# Patient Record
Sex: Female | Born: 1990 | Race: White | Hispanic: No | Marital: Single | Smoking: Never smoker
Health system: Southern US, Community
[De-identification: ages and names within clinical notes are randomized; demographics above are authoritative.]

## PROBLEM LIST (undated history)

## (undated) DIAGNOSIS — G809 Cerebral palsy, unspecified: Secondary | ICD-10-CM

## (undated) DIAGNOSIS — K219 Gastro-esophageal reflux disease without esophagitis: Secondary | ICD-10-CM

## (undated) DIAGNOSIS — Q040 Congenital malformations of corpus callosum: Secondary | ICD-10-CM

## (undated) HISTORY — PX: MOUTH SURGERY: SHX715

## (undated) HISTORY — PX: ANKLE SURGERY: SHX546

## (undated) HISTORY — PX: EYE SURGERY: SHX253

---
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
## 2010-05-29 ENCOUNTER — Other Ambulatory Visit: Payer: Self-pay | Admitting: Family Medicine

## 2010-05-29 DIAGNOSIS — G809 Cerebral palsy, unspecified: Secondary | ICD-10-CM

## 2010-06-09 ENCOUNTER — Other Ambulatory Visit: Payer: Self-pay

## 2010-06-24 ENCOUNTER — Other Ambulatory Visit (HOSPITAL_COMMUNITY): Payer: Self-pay | Admitting: Family Medicine

## 2010-06-24 DIAGNOSIS — Z9189 Other specified personal risk factors, not elsewhere classified: Secondary | ICD-10-CM

## 2010-07-02 ENCOUNTER — Ambulatory Visit (HOSPITAL_COMMUNITY): Admission: RE | Admit: 2010-07-02 | Payer: Medicaid Other | Source: Ambulatory Visit

## 2010-08-28 NOTE — Discharge Summary (Signed)
Savannah Ruiz, Savannah Ruiz              ACCOUNT NO.:  [id number]   MEDICAL RECORD NO.:  [id number]          PATIENT TYPE:  INP   LOCATION:  6125                         FACILITY:  [facility]   PHYSICIAN:  Mark C. Ophelia Charter, M.D.    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   PROCEDURES:  Bilateral triple arthrodeses.   HISTORY OF PRESENT ILLNESS:  This 20 year old female has been followed with  spasticity with ambulation, wearing bilateral AFOs and skin problems due to  progressive deformity and inability to wear AFOs for support.  Posterior  tibia was active.  She had significant planovalgus laxity.  She was brought  in for triple arthrodesis bilaterally so that she could be fitted with  braces to continue ambulation.   After preoperative Ancef, informed consent with her mother, routine lab work  was unremarkable.  She was taken to the operating room and underwent  bilateral triple arthrodeses with 2 pins in each foot, postoperative casting  and cast split spread.  Postoperatively she was on some medication for  spasticity as well as morphine.  She had difficulty voiding and a Foley had  to be inserted.  She had nausea and vomiting x 2-3 after morphine.  She was  voiding on her own at the time of discharge and was comfortable with foot  elevation, good capillary refill.  Hospital followup in 1 week.   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      Mark C. Ophelia Charter, M.D.  Electronically Signed     MCY/MEDQ  D:  10/10/2005  T:  10/10/2005  Job:  [id number]

## 2010-08-28 NOTE — Op Note (Signed)
NAMESORAYAH, SCHRODT              ACCOUNT NO.:  [id number]   MEDICAL RECORD NO.:  [id number]          PATIENT TYPE:  INP   LOCATION:  6125                         FACILITY:  [facility]   PHYSICIAN:  Mark C. Ophelia Charter, M.D.    DATE OF BIRTH:  [birth year]   DATE OF PROCEDURE:  08/30/2005  DATE OF DISCHARGE:                                 OPERATIVE REPORT   PREOPERATIVE DIAGNOSIS:  Cerebral palsy, corpus callosum agenesis with  bilateral painful planovalgus feet.   POSTOPERATIVE DIAGNOSIS:  Cerebral palsy, corpus callosum agenesis with  bilateral painful planovalgus feet.   OPERATION PERFORMED:  Bilateral triple arthrodesis.   SURGEON:  Mark C. Ophelia Charter, M.D.   ANESTHESIA:  General plus Marcaine local.   TOURNIQUET TIME:  Left 1 hour 10 minutes x 250 mmHg, right 47 minutes x 250  mmHg.   DESCRIPTION OF PROCEDURE:  After induction of general anesthesia,  preoperative antibiotics, the patient received 1 g of Ancef preoperatively.  Bilateral proximal thigh tourniquets were applied over stockinette and both  feet were prepped with DuraPrep up to the knees.  Stockinettes, extremity  sheets, rolled towel, towel clip were applied.  Left foot was more severe  and was done first.  Leg was wrapped with an Esmarch, tourniquet inflated.  Skin marker lines were made with oblique incisions halfway between the  distal aspect of the fibula and the base of the fifth metatarsal following  the skin folds.  The perineal tendon was used for landmark, carefully  protected.  Osteo-periosteal flap was made exposing the calcaneocuboid joint  using a 1/4 inch curved osteotome.  The cartilage was scraped off both sides  of the joint.  Sustentaculum tali was knocked off with a 1/2 inch straight  osteotome exposing the anterior facet of the subtalar joint.  This was  cleaned off with a laminar spreader, placed in the midportion of the  subtalar joint exposing the anterior and posterior subtalar joint articular  surface.  Cartilage was stripped off using the quarter inch curved osteotome  removing the articular cartilage, exposing the subchondral bone.  There was  considerable valgus positioning with uncovering of the talus medially and  after the lateral aspect of the talonavicular joint was cleaned off with  Crego protecting the anterior structures, incision was made medially.  Talonavicular joint was exposed medially and the bone was cleared off the  head of the talus and navicula.  With all cartilage removed, good  subchondral bone, foot was repositioned, stab incisions were made medial and  lateral and pin was drilled across the calcaneocuboid joint retrograde and  then another pin drilled across the talonavicular joint retrograde.  Talus  was held in a covered position and bone graft was packed into the joint from  the sustentaculum tali that had been cleaned off and packed particularly in  the talonavicular joint.  AP lateral and oblique spot fluoro pictures were  taken documenting good position of the foot and spot picture as taken during  the procedure to make sure that the posterior aspect of the subtalar joint  was adequately cleaned off and this was  extended all the way back to the  capsule.  The Vicryl 4-0 was used for reapproximating the subcutaneous  tissue, chromic 4-0 subcuticular skin closure.  The stab incisions were  enlarged slightly for release of skin tension on the pins.  The pins were  bent 90 degrees and cut.  Medial and lateral incisions were closed with  identical sutures, see above.  Tourniquet was deflated prior to closure.  Hemovac was placed through a separate stab incision, left laterally.  Xeroform after Marcaine with epinephrine, 4 x 4s ABD and Webril was applied.   The patient's right foot which was not as severe as the left was then  surgically treated.  An identical procedure was performed on this foot in  the same sequence.  On the right foot there was not as  severe uncoverage of  the talus with planovalgus deformity and a medial incision was not necessary  in order to get the talonavicular joint adequately prepared.  Osteo-  periosteal flap was used for exposure of the calcaneocuboid and subtalar  joints were cleaned off.  Identical pinning was performed.  Fluoro spot  pictures.  Tourniquet deflation, hemostasis, Hemovac drain placement and  then closure.  Chromic again was used on the skin.  Xeroform.  Pins were cut  after being bent and position of the foot showed good correction of the  planovalgus deformity.  Good length of the pins.  The wound was irrigated.  Marcaine then was infiltrated prior to closure and identical bilateral short  leg fiberglass casts were applied.  Casts were split anteriorly, spread and  then patient was then transferred to the recovery room in stable condition.  Instrument count and needle count was correct.      Mark C. Ophelia Charter, M.D.  Electronically Signed     MCY/MEDQ  D:  08/30/2005  T:  08/31/2005  Job:  [id number]

## 2018-10-17 ENCOUNTER — Emergency Department (HOSPITAL_COMMUNITY): Payer: Medicaid Other

## 2018-10-17 ENCOUNTER — Other Ambulatory Visit: Payer: Self-pay

## 2018-10-17 ENCOUNTER — Ambulatory Visit: Admit: 2018-10-17 | Payer: Medicaid Other | Admitting: Otolaryngology

## 2018-10-17 ENCOUNTER — Encounter (HOSPITAL_COMMUNITY)
Admission: EM | Disposition: A | Discharge status: Home / Self Care | Payer: Self-pay | Source: Home / Self Care | Attending: Otolaryngology

## 2018-10-17 ENCOUNTER — Emergency Department (HOSPITAL_COMMUNITY): Payer: Medicaid Other | Admitting: Certified Registered Nurse Anesthetist

## 2018-10-17 ENCOUNTER — Encounter (HOSPITAL_COMMUNITY): Payer: Self-pay

## 2018-10-17 ENCOUNTER — Ambulatory Visit (HOSPITAL_COMMUNITY)
Admission: EM | Admit: 2018-10-17 | Discharge: 2018-10-18 | Disposition: A | Discharge status: Home / Self Care | Payer: Medicaid Other | Attending: Otolaryngology | Admitting: Otolaryngology

## 2018-10-17 DIAGNOSIS — S01511A Laceration without foreign body of lip, initial encounter: Secondary | ICD-10-CM | POA: Diagnosis not present

## 2018-10-17 DIAGNOSIS — S0181XA Laceration without foreign body of other part of head, initial encounter: Secondary | ICD-10-CM | POA: Insufficient documentation

## 2018-10-17 DIAGNOSIS — S02609B Fracture of mandible, unspecified, initial encounter for open fracture: Secondary | ICD-10-CM | POA: Diagnosis present

## 2018-10-17 DIAGNOSIS — Z79899 Other long term (current) drug therapy: Secondary | ICD-10-CM | POA: Insufficient documentation

## 2018-10-17 DIAGNOSIS — Z791 Long term (current) use of non-steroidal anti-inflammatories (NSAID): Secondary | ICD-10-CM | POA: Diagnosis not present

## 2018-10-17 DIAGNOSIS — S02609A Fracture of mandible, unspecified, initial encounter for closed fracture: Secondary | ICD-10-CM

## 2018-10-17 DIAGNOSIS — W19XXXA Unspecified fall, initial encounter: Secondary | ICD-10-CM | POA: Insufficient documentation

## 2018-10-17 DIAGNOSIS — S0266XA Fracture of symphysis of mandible, initial encounter for closed fracture: Secondary | ICD-10-CM | POA: Insufficient documentation

## 2018-10-17 DIAGNOSIS — G809 Cerebral palsy, unspecified: Secondary | ICD-10-CM | POA: Diagnosis not present

## 2018-10-17 DIAGNOSIS — S02651A Fracture of angle of right mandible, initial encounter for closed fracture: Secondary | ICD-10-CM | POA: Diagnosis not present

## 2018-10-17 DIAGNOSIS — S01512A Laceration without foreign body of oral cavity, initial encounter: Secondary | ICD-10-CM | POA: Insufficient documentation

## 2018-10-17 DIAGNOSIS — Z1159 Encounter for screening for other viral diseases: Secondary | ICD-10-CM | POA: Insufficient documentation

## 2018-10-17 HISTORY — PX: ORIF MANDIBULAR FRACTURE: SHX2127

## 2018-10-17 LAB — COMPREHENSIVE METABOLIC PANEL
ALT: 41 U/L (ref 0–44)
AST: 35 U/L (ref 15–41)
Albumin: 4 g/dL (ref 3.5–5.0)
Alkaline Phosphatase: 82 U/L (ref 38–126)
Anion gap: 9 (ref 5–15)
BUN: 13 mg/dL (ref 6–20)
CO2: 25 mmol/L (ref 22–32)
Calcium: 8.5 mg/dL — ABNORMAL LOW (ref 8.9–10.3)
Chloride: 107 mmol/L (ref 98–111)
Creatinine, Ser: 0.34 mg/dL — ABNORMAL LOW (ref 0.44–1.00)
GFR calc Af Amer: >60 mL/min (ref >60)
GFR calc non Af Amer: >60 mL/min (ref >60)
Glucose, Bld: 99 mg/dL (ref 70–99)
Potassium: 3.5 mmol/L (ref 3.5–5.1)
Sodium: 141 mmol/L (ref 135–145)
Total Bilirubin: 0.2 mg/dL — ABNORMAL LOW (ref 0.3–1.2)
Total Protein: 7 g/dL (ref 6.5–8.1)

## 2018-10-17 LAB — CBC WITH DIFFERENTIAL/PLATELET
Abs Immature Granulocytes: 0.07 10*3/uL (ref 0.00–0.07)
Basophils Absolute: 0.1 10*3/uL (ref 0.0–0.1)
Basophils Relative: 0 %
Eosinophils Absolute: 0 10*3/uL (ref 0.0–0.5)
Eosinophils Relative: 0 %
HCT: 38.3 % (ref 36.0–46.0)
Hemoglobin: 12.3 g/dL (ref 12.0–15.0)
Immature Granulocytes: 0 %
Lymphocytes Relative: 10 %
Lymphs Abs: 1.7 10*3/uL (ref 0.7–4.0)
MCH: 31.1 pg (ref 26.0–34.0)
MCHC: 32.1 g/dL (ref 30.0–36.0)
MCV: 96.7 fL (ref 80.0–100.0)
Monocytes Absolute: 1.5 10*3/uL — ABNORMAL HIGH (ref 0.1–1.0)
Monocytes Relative: 9 %
Neutro Abs: 12.9 10*3/uL — ABNORMAL HIGH (ref 1.7–7.7)
Neutrophils Relative %: 81 %
Platelets: 286 10*3/uL (ref 150–400)
RBC: 3.96 MIL/uL (ref 3.87–5.11)
RDW: 12.3 % (ref 11.5–15.5)
WBC: 16.3 10*3/uL — ABNORMAL HIGH (ref 4.0–10.5)
nRBC: 0 % (ref 0.0–0.2)

## 2018-10-17 LAB — SARS CORONAVIRUS 2 BY RT PCR (HOSPITAL ORDER, PERFORMED IN ~~LOC~~ HOSPITAL LAB): SARS Coronavirus 2: NEGATIVE

## 2018-10-17 SURGERY — OPEN REDUCTION INTERNAL FIXATION (ORIF) MANDIBULAR FRACTURE
Anesthesia: General | Site: Face

## 2018-10-17 MED ORDER — MORPHINE SULFATE (PF) 2 MG/ML IV SOLN
2.0000 mg | Freq: Once | INTRAVENOUS | Status: AC
  Administered 2018-10-17: 2 mg via INTRAMUSCULAR
  Filled 2018-10-17: qty 1

## 2018-10-17 MED ORDER — MIDAZOLAM HCL 2 MG/2ML IJ SOLN
INTRAMUSCULAR | Status: AC
  Filled 2018-10-17: qty 2

## 2018-10-17 MED ORDER — 0.9 % SODIUM CHLORIDE (POUR BTL) OPTIME
TOPICAL | Status: DC | PRN
  Administered 2018-10-17: 18:00:00 1000 mL

## 2018-10-17 MED ORDER — ROCURONIUM BROMIDE 100 MG/10ML IV SOLN
INTRAVENOUS | Status: DC | PRN
  Administered 2018-10-17: 30 mg via INTRAVENOUS

## 2018-10-17 MED ORDER — ACETAMINOPHEN 10 MG/ML IV SOLN
460.0000 mg | Freq: Once | INTRAVENOUS | Status: DC | PRN
  Filled 2018-10-17: qty 46

## 2018-10-17 MED ORDER — HYDROCODONE-ACETAMINOPHEN 7.5-325 MG/15ML PO SOLN
5.0000 mL | Freq: Four times a day (QID) | ORAL | Status: DC | PRN
  Administered 2018-10-18: 5 mL via ORAL
  Filled 2018-10-17: qty 15

## 2018-10-17 MED ORDER — DEXTROSE IN LACTATED RINGERS 5 % IV SOLN
INTRAVENOUS | Status: DC
  Administered 2018-10-17: 23:00:00 via INTRAVENOUS

## 2018-10-17 MED ORDER — LORAZEPAM 2 MG/ML IJ SOLN
1.0000 mg | Freq: Once | INTRAMUSCULAR | Status: AC
  Administered 2018-10-17: 1 mg via INTRAMUSCULAR
  Filled 2018-10-17: qty 1

## 2018-10-17 MED ORDER — CEFAZOLIN SODIUM-DEXTROSE 1-4 GM/50ML-% IV SOLN
1.0000 g | Freq: Three times a day (TID) | INTRAVENOUS | Status: AC
  Administered 2018-10-17 – 2018-10-18 (×2): 1 g via INTRAVENOUS
  Filled 2018-10-17 (×2): qty 50

## 2018-10-17 MED ORDER — FENTANYL CITRATE (PF) 100 MCG/2ML IJ SOLN
INTRAMUSCULAR | Status: DC | PRN
  Administered 2018-10-17: 50 ug via INTRAVENOUS

## 2018-10-17 MED ORDER — DEXAMETHASONE SODIUM PHOSPHATE 10 MG/ML IJ SOLN
6.0000 mg | Freq: Three times a day (TID) | INTRAMUSCULAR | Status: AC
  Administered 2018-10-17 – 2018-10-18 (×2): 6 mg via INTRAVENOUS
  Filled 2018-10-17 (×2): qty 1

## 2018-10-17 MED ORDER — ONDANSETRON HCL 4 MG/2ML IJ SOLN
INTRAMUSCULAR | Status: DC | PRN
  Administered 2018-10-17: 4 mg via INTRAVENOUS

## 2018-10-17 MED ORDER — PROPOFOL 10 MG/ML IV BOLUS
INTRAVENOUS | Status: AC
  Filled 2018-10-17: qty 20

## 2018-10-17 MED ORDER — ONDANSETRON HCL 4 MG PO TABS: 4.0000 mg | ORAL_TABLET | ORAL | Status: DC | PRN

## 2018-10-17 MED ORDER — FENTANYL CITRATE (PF) 250 MCG/5ML IJ SOLN
INTRAMUSCULAR | Status: AC
  Filled 2018-10-17: qty 5

## 2018-10-17 MED ORDER — FENTANYL CITRATE (PF) 100 MCG/2ML IJ SOLN: 25.0000 ug | INTRAMUSCULAR | Status: DC | PRN

## 2018-10-17 MED ORDER — OXYMETAZOLINE HCL 0.05 % NA SOLN
NASAL | Status: AC
  Filled 2018-10-17: qty 30

## 2018-10-17 MED ORDER — OXYMETAZOLINE HCL 0.05 % NA SOLN
NASAL | Status: DC | PRN
  Administered 2018-10-17: 1

## 2018-10-17 MED ORDER — BACITRACIN ZINC 500 UNIT/GM EX OINT
TOPICAL_OINTMENT | CUTANEOUS | Status: DC | PRN
  Administered 2018-10-17: 1 via TOPICAL

## 2018-10-17 MED ORDER — LIDOCAINE-EPINEPHRINE 1 %-1:100000 IJ SOLN
INTRAMUSCULAR | Status: DC | PRN
  Administered 2018-10-17: 6 mL

## 2018-10-17 MED ORDER — PROMETHAZINE HCL 25 MG/ML IJ SOLN: 6.2500 mg | INTRAMUSCULAR | Status: DC | PRN

## 2018-10-17 MED ORDER — DEXAMETHASONE SODIUM PHOSPHATE 10 MG/ML IJ SOLN
INTRAMUSCULAR | Status: DC | PRN
  Administered 2018-10-17: 10 mg via INTRAVENOUS

## 2018-10-17 MED ORDER — SODIUM CHLORIDE 0.9 % IV SOLN
INTRAVENOUS | Status: DC | PRN
  Administered 2018-10-17: 17:00:00 via INTRAVENOUS

## 2018-10-17 MED ORDER — LACTATED RINGERS IV SOLN
INTRAVENOUS | Status: DC
  Administered 2018-10-17: 19:00:00 via INTRAVENOUS

## 2018-10-17 MED ORDER — IBUPROFEN 100 MG/5ML PO SUSP: 300.0000 mg | Freq: Three times a day (TID) | ORAL | Status: DC | PRN

## 2018-10-17 MED ORDER — BACITRACIN ZINC 500 UNIT/GM EX OINT
TOPICAL_OINTMENT | CUTANEOUS | Status: AC
  Filled 2018-10-17: qty 28.35

## 2018-10-17 MED ORDER — DEXTROSE 5 % IV SOLN: 0.5000 g | Freq: Once | INTRAVENOUS | Status: DC

## 2018-10-17 MED ORDER — LIDOCAINE HCL (CARDIAC) PF 100 MG/5ML IV SOSY
PREFILLED_SYRINGE | INTRAVENOUS | Status: DC | PRN
  Administered 2018-10-17: 100 mg via INTRAVENOUS

## 2018-10-17 MED ORDER — SUGAMMADEX SODIUM 200 MG/2ML IV SOLN
INTRAVENOUS | Status: DC | PRN
  Administered 2018-10-17: 61.6 mg via INTRAVENOUS

## 2018-10-17 MED ORDER — CEFAZOLIN SODIUM-DEXTROSE 1-4 GM/50ML-% IV SOLN
1.0000 g | Freq: Once | INTRAVENOUS | Status: DC
  Filled 2018-10-17: qty 50

## 2018-10-17 MED ORDER — ONDANSETRON HCL 4 MG/2ML IJ SOLN: 4.0000 mg | INTRAMUSCULAR | Status: DC | PRN

## 2018-10-17 MED ORDER — BACITRACIN ZINC 500 UNIT/GM EX OINT
1.0000 "application " | TOPICAL_OINTMENT | Freq: Three times a day (TID) | CUTANEOUS | Status: DC
  Administered 2018-10-17 – 2018-10-18 (×2): 1 via TOPICAL
  Filled 2018-10-17: qty 28.4

## 2018-10-17 MED ORDER — LIDOCAINE-EPINEPHRINE 1 %-1:100000 IJ SOLN
INTRAMUSCULAR | Status: AC
  Filled 2018-10-17: qty 1

## 2018-10-17 MED ORDER — MIDAZOLAM HCL 5 MG/5ML IJ SOLN
INTRAMUSCULAR | Status: DC | PRN
  Administered 2018-10-17: 1 mg via INTRAVENOUS

## 2018-10-17 MED ORDER — TETANUS-DIPHTH-ACELL PERTUSSIS 5-2.5-18.5 LF-MCG/0.5 IM SUSP
0.5000 mL | Freq: Once | INTRAMUSCULAR | Status: AC
  Administered 2018-10-17: 0.5 mL via INTRAMUSCULAR
  Filled 2018-10-17: qty 0.5

## 2018-10-17 SURGICAL SUPPLY — 51 items
BIT DRILL DEPTH MARK1.8X115 26 (MISCELLANEOUS) IMPLANT
BLADE SURG 15 STRL LF DISP TIS (BLADE) IMPLANT
BLADE SURG 15 STRL SS (BLADE)
CANISTER SUCT 3000ML PPV (MISCELLANEOUS) ×3 IMPLANT
CLEANER TIP ELECTROSURG 2X2 (MISCELLANEOUS) ×3 IMPLANT
COVER WAND RF STERILE (DRAPES) ×3 IMPLANT
DRAPE HALF SHEET 40X57 (DRAPES) IMPLANT
DRILL DEPTH MARK1.8X115 26 (MISCELLANEOUS) ×3
ELECT COATED BLADE 2.86 ST (ELECTRODE) ×3 IMPLANT
ELECT NDL TIP 2.8 STRL (NEEDLE) IMPLANT
ELECT NEEDLE TIP 2.8 STRL (NEEDLE) IMPLANT
ELECT REM PT RETURN 9FT ADLT (ELECTROSURGICAL) ×3
ELECTRODE REM PT RTRN 9FT ADLT (ELECTROSURGICAL) ×1 IMPLANT
GLOVE BIOGEL M 7.0 STRL (GLOVE) ×3 IMPLANT
GOWN STRL REUS W/ TWL LRG LVL3 (GOWN DISPOSABLE) ×2 IMPLANT
GOWN STRL REUS W/TWL LRG LVL3 (GOWN DISPOSABLE) ×6
KIT BASIN OR (CUSTOM PROCEDURE TRAY) ×3 IMPLANT
KIT TURNOVER KIT B (KITS) ×3 IMPLANT
NDL HYPO 25GX1X1/2 BEV (NEEDLE) IMPLANT
NEEDLE HYPO 25GX1X1/2 BEV (NEEDLE) IMPLANT
NS IRRIG 1000ML POUR BTL (IV SOLUTION) ×3 IMPLANT
PAD ARMBOARD 7.5X6 YLW CONV (MISCELLANEOUS) ×6 IMPLANT
PATTIES SURGICAL .5 X3 (DISPOSABLE) IMPLANT
PENCIL BUTTON HOLSTER BLD 10FT (ELECTRODE) ×3 IMPLANT
PLATE LOCK 4H STRT 1.6 GOLD (Plate) ×2 IMPLANT
PLATE LOCK SML ANGLE 1.6 GOLD (Plate) ×2 IMPLANT
SCISSORS WIRE DISP (INSTRUMENTS) ×3 IMPLANT
SCREW NON LOCK X-DR 2.3X10 (Screw) ×6 IMPLANT
SCREW NON LOCK X-DR 2.3X12 (Screw) ×8 IMPLANT
SCREW NON LOCK X-DR 2.3X8 (Screw) ×10 IMPLANT
SCREW SD IMF HEX 2.0X7 (Screw) ×4 IMPLANT
SCREW SD IMF HEX 2.0X9 (Screw) ×4 IMPLANT
STAPLER VISISTAT 35W (STAPLE) ×3 IMPLANT
SUT BONE WAX W31G (SUTURE) IMPLANT
SUT CHROMIC 3 0 SH 27 (SUTURE) ×6 IMPLANT
SUT ETHILON 3 0 PS 1 (SUTURE) ×3 IMPLANT
SUT ETHILON 6 0 P 1 (SUTURE) ×2 IMPLANT
SUT SILK 3 0 (SUTURE)
SUT SILK 3 0 SH 30 (SUTURE) ×3 IMPLANT
SUT SILK 3-0 18XBRD TIE 12 (SUTURE) IMPLANT
SUT STEEL 0 (SUTURE)
SUT STEEL 0 18XMFL TIE 17 (SUTURE) IMPLANT
SUT STEEL 2 (SUTURE) ×3 IMPLANT
SUT VIC AB 3-0 FS2 27 (SUTURE) IMPLANT
SUT VIC AB 4-0 P-3 18X BRD (SUTURE) IMPLANT
SUT VIC AB 4-0 P3 18 (SUTURE)
SUT VIC AB 5-0 P-3 18XBRD (SUTURE) IMPLANT
SUT VIC AB 5-0 P3 18 (SUTURE)
TOWEL GREEN STERILE FF (TOWEL DISPOSABLE) ×3 IMPLANT
TRAY ENT MC OR (CUSTOM PROCEDURE TRAY) ×3 IMPLANT
WATER STERILE IRR 1000ML POUR (IV SOLUTION) ×3 IMPLANT

## 2018-10-17 NOTE — ED Notes (Signed)
HALO TEST COMPLETED ON BLOOD FROM EAR IT WAS NEGATIVE FOR CEREBRAL FLUID

## 2018-10-17 NOTE — ED Provider Notes (Signed)
[facility]-EMERGENCY DEPT Provider Note   CSN: [id number] Arrival date & time: 10/17/18  1004  History   Chief Complaint Chief Complaint  Patient presents with   Fall    HPI Savannah Ruiz is a 28 y.o. female with past medical history significant for cerebral palsy, nonverbal at baseline who presents for evaluation of fall.  Mother presents with patient provides majority of history.  Kathleen LimeGiver was with patient when she was ambulating.  States that she normally walks with a walker behind her however they feel that the patient hit her right foot on the walker which caused her to fall on her face.  Mother noticed a laceration to her right lower lip as well as bleeding from her left ear.  She has been mentating appropriately per mother.  Moves 4 extremities without difficulty.  Mother states she is contracted at baseline.   Ambulatory with walker at baseline  5 caveat-- nonverbal at baseline  History provided by mother and past medical records. No interpretor was used.  Patient eats soft/ground food spoon feed at baseline.     HPI  History reviewed. No pertinent past medical history.  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 No pertinent surgical history.   OB History   No obstetric history on file.      Home Medications    Prior to Admission medications   Medication Sig Start Date End Date Taking? Authorizing Provider  ibuprofen (ADVIL) 200 MG tablet Take 200 mg by mouth every 6 (six) hours as needed for mild pain.   Yes [provider]  LORazepam (ATIVAN) 0.5 MG tablet Take 0.5 mg by mouth daily as needed for anxiety.  09/22/18  Yes [provider]  omeprazole (PRILOSEC) 20  MG capsule Take 20 mg by mouth daily. 09/28/18  Yes [provider]    Family History History reviewed. No pertinent family history.  Social History Social History   Tobacco Use   Smoking status: Never Smoker   Smokeless tobacco: Never Used  Substance Use Topics   Alcohol use: Never    Frequency: Never   Drug use: Never     Allergies   Patient has no known allergies.   Review of Systems Review of Systems  Unable to perform ROS: Patient nonverbal     Physical Exam Updated Vital Signs BP 128/85 (BP Location: Left Arm)    Pulse 99    Temp 97.6 F (36.4 C) (Axillary)    Resp 17    LMP 10/03/2018    SpO2 98%   Physical Exam Vitals signs and nursing note reviewed.  Constitutional:      General: She is not in acute distress.    Appearance: She is well-developed. She is not ill-appearing, toxic-appearing or diaphoretic.  HENT:     Head: Atraumatic.     Comments: 1cm non gapping laceration to inferior chin. No bleeding noted.    Ears:     Comments: Unable to visualize left tympanic membrane.  Blood pooling in external canal.  She does have scab to left external canal. Right TM without blood, effusion, perforation.    Nose: Nose normal.     Mouth/Throat:     Lips: Pink.  Mouth: Mucous membranes are moist.     Pharynx: Oropharynx is clear.     Comments: 8mm superficial laceration to inner right lower lip. No gaping. No bleeding. 1cm superficial laceration to right posterior tongue. No gaping. No active bleeding. No missing dentition. Fracture tooth to right posterior lower molar.  Eyes:     General: Lids are normal.     Extraocular Movements: Extraocular movements intact.     Conjunctiva/sclera: Conjunctivae normal.     Pupils: Pupils are equal, round, and reactive to light.  Neck:     Musculoskeletal: Normal range of motion and neck supple.     Comments: No midline spinal tenderness Cardiovascular:     Rate and Rhythm: Normal rate.     Pulses: Normal  pulses.          Dorsalis pedis pulses are 2+ on the right side and 2+ on the left side.       Posterior tibial pulses are 2+ on the right side and 2+ on the left side.     Heart sounds: Normal heart sounds.  Pulmonary:     Effort: Pulmonary effort is normal. No respiratory distress.     Breath sounds: Normal breath sounds and air entry.  Chest:     Comments: No crepitus, step-offs or contusions or abrasions to chest wall Abdominal:     General: There is no distension.     Comments: Soft without rebound or guarding no overlying skin changes to abdominal wall.  Musculoskeletal: Normal range of motion.     Comments: Contracted BL upper and lower extremities. Wiggles toes without difficulty.  Scoliosis present to spine.  No overlying skin changes to spine.  No obvious tenderness palpation to spine.  Skin:    General: Skin is warm and dry.     Comments: Brisk cap refill. No lacerations amenable to suturing.  Neurological:     General: No focal deficit present.     Mental Status: She is alert.     Comments: Patient steps with assistance.  She also crawls on the floor without any deficit.  Moves all 4 extremities without difficulty.     ED Treatments / Results  Labs (all labs ordered are listed, but only abnormal results are displayed) Labs Reviewed  CBC WITH DIFFERENTIAL/PLATELET - Abnormal; Notable for the following components:      Result Value   WBC 16.3 (*)    Neutro Abs 12.9 (*)    Monocytes Absolute 1.5 (*)    All other components within normal limits  SARS CORONAVIRUS 2 (HOSPITAL ORDER, PERFORMED IN [facility])  COMPREHENSIVE METABOLIC PANEL    EKG None  Radiology Ct Head Wo Contrast  Result Date: 10/17/2018 CLINICAL DATA:  Facial injury after fall. EXAM: CT HEAD WITHOUT CONTRAST CT MAXILLOFACIAL WITHOUT CONTRAST TECHNIQUE: Multidetector CT imaging of the head and maxillofacial structures were performed using the standard protocol without intravenous  contrast. Multiplanar CT image reconstructions of the maxillofacial structures were also generated. COMPARISON:  None. FINDINGS: CT HEAD FINDINGS Brain: No evidence of acute infarction, hemorrhage, hydrocephalus, extra-axial collection or mass lesion/mass effect. Vascular: No hyperdense vessel or unexpected calcification. Skull: Normal. Negative for fracture or focal lesion. Other: None. CT MAXILLOFACIAL FINDINGS Osseous: Nondisplaced fracture is seen involving the right parasymphyseal region of the mandible. Mildly displaced fracture is seen involving the right mandibular angle which extends to the most posterior molar. Orbits: Negative. No traumatic or inflammatory finding. Sinuses: Clear. Soft tissues: Negative. IMPRESSION: Mildly displaced right  mandibular angle fracture is noted, with nondisplaced right parasymphyseal fracture of the mandible. Normal head CT. Electronically Signed   By: Lupita RaiderJames  Green Jr M.D.   On: 10/17/2018 12:52   Ct Maxillofacial Wo Contrast  Result Date: 10/17/2018 CLINICAL DATA:  Facial injury after fall. EXAM: CT HEAD WITHOUT CONTRAST CT MAXILLOFACIAL WITHOUT CONTRAST TECHNIQUE: Multidetector CT imaging of the head and maxillofacial structures were performed using the standard protocol without intravenous contrast. Multiplanar CT image reconstructions of the maxillofacial structures were also generated. COMPARISON:  None. FINDINGS: CT HEAD FINDINGS Brain: No evidence of acute infarction, hemorrhage, hydrocephalus, extra-axial collection or mass lesion/mass effect. Vascular: No hyperdense vessel or unexpected calcification. Skull: Normal. Negative for fracture or focal lesion. Other: None. CT MAXILLOFACIAL FINDINGS Osseous: Nondisplaced fracture is seen involving the right parasymphyseal region of the mandible. Mildly displaced fracture is seen involving the right mandibular angle which extends to the most posterior molar. Orbits: Negative. No traumatic or inflammatory finding. Sinuses:  Clear. Soft tissues: Negative. IMPRESSION: Mildly displaced right mandibular angle fracture is noted, with nondisplaced right parasymphyseal fracture of the mandible. Normal head CT. Electronically Signed   By: Lupita RaiderJames  Green Jr M.D.   On: 10/17/2018 12:52    Procedures Procedures (including critical care time)  Medications Ordered in ED Medications  ceFAZolin (ANCEF) IVPB 1 g/50 mL premix (has no administration in time range)  LORazepam (ATIVAN) injection 1 mg (1 mg Intramuscular Given 10/17/18 1158)  Tdap (BOOSTRIX) injection 0.5 mL (0.5 mLs Intramuscular Given 10/17/18 1401)  morphine 2 MG/ML injection 2 mg (2 mg Intramuscular Given 10/17/18 1427)   Initial Impression / Assessment and Plan / ED Course  I have reviewed the triage vital signs and the nursing notes.  Pertinent labs & imaging results that were available during my care of the patient were reviewed by me and considered in my medical decision making (see chart for details).  28 year old with cerebral palsy, nonverbal at baseline presents for evaluation of mechanical fall.  Afebrile, nonseptic, non-ill-appearing.  Patient presents with mother who states patient is at her baseline mental status.  Patient with mechanical fall onto her head and face just PTA.  Patient was pulling blood in her left external ear.  Unable to visualize tympanic membrane secondary to blood.  She does have scab in this area.  She also has a millimeter non-gaping laceration to her right inner lip.  No active bleeding.  She has 1 cm laceration to her right posterior tongue without bleeding.  Laceration superficial.  Discussed with mother risk versus benefit of suturing including needed sedation.  Discussed rinsing of area after she eats or drinks with healing by secondary intent.  Been unable to obtain proper medical history as well as visualize tympanic membrane will need CT scan to assess for acute intracranial process.  Halo test negative per nursing.  Will give 1 mg  Ativan for light sedation for CT scan. Concern for possible open fracture given limited exam.  Would like to give IV Ancef for possible open fracture however mother would like to wait to talk with surgery first.  CT with mildly displaced right mandibular angle fracture is noted, with nondisplaced right parasymphyseal fracture of the mandible.  1436: Consulted with Dr. Annalee GentaShoemaker, ENT who states he will review images at office.  Patient may require surgical intervention.  He will call me back once he is able to look at the CT for dispo. Patient given Morphine for pain management and labs and COVID testing to  be obtain for possible surgical intervention.  1515: Pharmacy called and recommend 1g ANCEF instead of 500 mg. Will order.  Tilted again with Dr. Wilburn Cornelia who will take patient to the OR at [facility].  Patient will be transferred via [place].  She is hemodynamically stable at this time.  Patient to be transferred to preop.  Patient has had labs, COVID testing as well as Ancef given in ED prior to transfer.  Patient has been seen and evaluated by my attending, Dr. Vanita Panda who agrees with above treatment, plan and disposition.     Final Clinical Impressions(s) / ED Diagnoses   Final diagnoses:  Multiple mandibular fracture sites, closed, initial encounter [facility])    ED Discharge Orders    None       Ceferino Lang A, PA-C 10/17/18 1554    Carmin Muskrat, MD 10/18/18 [phone]

## 2018-10-17 NOTE — Op Note (Signed)
Operative Note: OPEN REDUCTION AND INTERNAL FIXATION OF MANDIBLE FRACTURE   Patient: [place] record number: [id number]  Date:10/17/2018  Pre-operative Indications: 1. Mandible fracture     2. Lower Lip Laceration     3.  2 cm facial laceration  Postoperative Indications: Same  Surgical Procedure: 1. ORIF of Mandible Fracture    2.  Closure lower lip laceration    3.  Complex closure facial laceration  Anesthesia: GET  Surgeon: Delsa Bern, M.D.  Complications: None  EBL: 100 cc   Brief History: The patient is a 28 y.o. female with a history of acute mandible fracture.  Maxillofacial CT scan showed mandibular fractures involving the right parasymphyseal region, minimally displaced, and a displaced right angle fracture.  Patient has severe developmental delay and is nonverbal she eats only soft diet.  Given her history and findings her parents and I discussed treatment options including mandibulomaxillary fixation versus ORIF of mandible fracture.  Given the patient's history and findings, I recommended ORIF of mandible fracture under general anesthesia, risks and benefits were discussed in detail with the patient and family. They understand and agree with our plan for surgery which is scheduled at [facility] on an emergency basis.  Surgical Procedure: The patient is brought to the operating room on 10/17/2018 and placed in supine position on the operating table. General nasal endotracheal anesthesia was established without difficulty. When the patient was adequately anesthetized, surgical timeout was performed and correct identification of the patient and the surgical procedure. The patient was positioned and prepped and draped in sterile fashion.  The patient was injected with 6 cc of 1% lidocaine 1 100,000 dilution epinephrine in a submucosal fashion along the anticipated incision lines.  An orogastric tube was passed and the stomach contents were  aspirated.  The patient's oral cavity was thoroughly examined.  Findings include posterior oral laceration at the mandibular angle fracture site with missing posterior molar.  2 cm lower lip laceration and 2 cm external laceration over the chin.  The patient was placed in good occlusion and the fracture sites were reduced.  Mandibulomaxillary fixation/arch bars were placed.  Four-point fixation was utilized to maintain occlusion throughout the ORIF procedure.  Gingivobuccal incisions were created over the fracture sites and soft tissue was elevated to expose the entire fracture.  Using mandibular reconstruction plates the fractures were fixated with plates and screws while the patient was maintained in good occlusion.  The posterior mandible angle fracture plate was affixed using a percutaneous approach for the posterior most screws.  The stab incision in the right posterior perimandibular area was closed with interrupted 6-0 Ethilon suture and treated with bacitracin ointment.  The plates, incision and mandible were thoroughly irrigated.  Intraoral incisions were then closed using interrupted 4-0 chromic suture.  Patient's oral cavity was irrigated and suctioned.  The intraoral laceration was then closed with interrupted 4-0 chromic suture.  The external chin 2 cm complex laceration was then closed in multiple layers consisting of interrupted 4-0 Vicryl suture followed by 6-0 Ethilon suture.  Bacitracin ointment was applied to the laceration.  An orogastric tube was passed and stomach contents were aspirated. Patient was awakened from anesthetic, extubated and transferred from the operating room to the recovery room in stable condition. There were no complications and blood loss was 100 cc.   Delsa Bern, M.D. [facility]

## 2018-10-17 NOTE — ED Notes (Signed)
Attempted to call short stay phone line is busy. Will attempt again shortly.

## 2018-10-17 NOTE — Anesthesia Preprocedure Evaluation (Addendum)
Anesthesia Evaluation  Patient identified by MRN, date of birth, ID band Patient awake    Reviewed: Allergy & Precautions, NPO status , Patient's Chart, lab work & pertinent test results  History of Anesthesia Complications Negative for: history of anesthetic complications  Airway   TM Distance: >3 FB Neck ROM: Full  Mouth opening: Limited Mouth Opening Comment: Unable to examine due to pain and mental status Dental  (+) Dental Advisory Given   Pulmonary neg pulmonary ROS,    Pulmonary exam normal        Cardiovascular negative cardio ROS Normal cardiovascular exam     Neuro/Psych Cerebral palsy, ambulates with walker, nonverbal    GI/Hepatic Neg liver ROS, GERD  Medicated and Controlled,  Endo/Other  negative endocrine ROS  Renal/GU negative Renal ROS     Musculoskeletal negative musculoskeletal ROS (+)   Abdominal   Peds  Hematology negative hematology ROS (+)   Anesthesia Other Findings Day of surgery medications reviewed with the patient.  Reproductive/Obstetrics                            Anesthesia Physical Anesthesia Plan  ASA: II  Anesthesia Plan: General   Post-op Pain Management:    Induction: Intravenous  PONV Risk Score and Plan: 4 or greater and Treatment may vary due to age or medical condition, Ondansetron, Dexamethasone, Scopolamine patch - Pre-op and Midazolam  Airway Management Planned: Nasal ETT and Video Laryngoscope Planned  Additional Equipment:   Intra-op Plan:   Post-operative Plan: Extubation in OR  Informed Consent: I have reviewed the patients History and Physical, chart, labs and discussed the procedure including the risks, benefits and alternatives for the proposed anesthesia with the patient or authorized representative who has indicated his/her understanding and acceptance.     Dental advisory given and Consent reviewed with POA  Plan  Discussed with:   Anesthesia Plan Comments: (Consent discussed with pt's mother at bedside.)       Anesthesia Quick Evaluation

## 2018-10-17 NOTE — Progress Notes (Signed)
Spoke to Dr. Daiva Huge concerning preg test. Doctor stated she did not need one before surgery.

## 2018-10-17 NOTE — H&P (Signed)
Savannah ROSEMAN is an 28 y.o. female.   Chief Complaint: mandible fracture HPI: Patient fell today and struck chin, [facility] ED shows displaced mandible fracture.  History reviewed. No pertinent past medical history.  History reviewed. No pertinent surgical history.  History reviewed. No pertinent family history. Social History:  reports that she has never smoked. She has never used smokeless tobacco. She reports that she does not drink alcohol or use drugs.  Allergies: No Known Allergies  Medications Prior to Admission  Medication Sig Dispense Refill  . ibuprofen (ADVIL) 200 MG tablet Take 200 mg by mouth every 6 (six) hours as needed for mild pain.    Marland Kitchen LORazepam (ATIVAN) 0.5 MG tablet Take 0.5 mg by mouth daily as needed for anxiety.     Marland Kitchen omeprazole (PRILOSEC) 20 MG capsule Take 20 mg by mouth daily.      Results for orders placed or performed during the hospital encounter of 10/17/18 (from the past 48 hour(s))  CBC with Differential     Status: Abnormal   Collection Time: 10/17/18  3:22 PM  Result Value Ref Range   WBC 16.3 (H) 4.0 - 10.5 K/uL   RBC 3.96 3.87 - 5.11 MIL/uL   Hemoglobin 12.3 12.0 - 15.0 g/dL   HCT 38.3 36.0 - 46.0 %   MCV 96.7 80.0 - 100.0 fL   MCH 31.1 26.0 - 34.0 pg   MCHC 32.1 30.0 - 36.0 g/dL   RDW 12.3 11.5 - 15.5 %   Platelets 286 150 - 400 K/uL   nRBC 0.0 0.0 - 0.2 %   Neutrophils Relative % 81 %   Neutro Abs 12.9 (H) 1.7 - 7.7 K/uL   Lymphocytes Relative 10 %   Lymphs Abs 1.7 0.7 - 4.0 K/uL   Monocytes Relative 9 %   Monocytes Absolute 1.5 (H) 0.1 - 1.0 K/uL   Eosinophils Relative 0 %   Eosinophils Absolute 0.0 0.0 - 0.5 K/uL   Basophils Relative 0 %   Basophils Absolute 0.1 0.0 - 0.1 K/uL   Immature Granulocytes 0 %   Abs Immature Granulocytes 0.07 0.00 - 0.07 K/uL    Comment: Performed at [facility], [place] [address] [place], [place] [address]  Comprehensive metabolic panel     Status: Abnormal   Collection Time: 10/17/18   3:22 PM  Result Value Ref Range   Sodium 141 135 - 145 mmol/L   Potassium 3.5 3.5 - 5.1 mmol/L   Chloride 107 98 - 111 mmol/L   CO2 25 22 - 32 mmol/L   Glucose, Bld 99 70 - 99 mg/dL   BUN 13 6 - 20 mg/dL   Creatinine, Ser 0.34 (L) 0.44 - 1.00 mg/dL   Calcium 8.5 (L) 8.9 - 10.3 mg/dL   Total Protein 7.0 6.5 - 8.1 g/dL   Albumin 4.0 3.5 - 5.0 g/dL   AST 35 15 - 41 U/L   ALT 41 0 - 44 U/L   Alkaline Phosphatase 82 38 - 126 U/L   Total Bilirubin 0.2 (L) 0.3 - 1.2 mg/dL   GFR calc non Af Amer >60 >60 mL/min   GFR calc Af Amer >60 >60 mL/min   Anion gap 9 5 - 15    Comment: Performed at [facility], [place] [address] [place], [place] [address]   Ct Head Wo Contrast  Result Date: 10/17/2018 CLINICAL DATA:  Facial injury after fall. EXAM: CT HEAD WITHOUT CONTRAST CT MAXILLOFACIAL WITHOUT CONTRAST TECHNIQUE: Multidetector CT  imaging of the head and maxillofacial structures were performed using the standard protocol without intravenous contrast. Multiplanar CT image reconstructions of the maxillofacial structures were also generated. COMPARISON:  None. FINDINGS: CT HEAD FINDINGS Brain: No evidence of acute infarction, hemorrhage, hydrocephalus, extra-axial collection or mass lesion/mass effect. Vascular: No hyperdense vessel or unexpected calcification. Skull: Normal. Negative for fracture or focal lesion. Other: None. CT MAXILLOFACIAL FINDINGS Osseous: Nondisplaced fracture is seen involving the right parasymphyseal region of the mandible. Mildly displaced fracture is seen involving the right mandibular angle which extends to the most posterior molar. Orbits: Negative. No traumatic or inflammatory finding. Sinuses: Clear. Soft tissues: Negative. IMPRESSION: Mildly displaced right mandibular angle fracture is noted, with nondisplaced right parasymphyseal fracture of the mandible. Normal head CT. Electronically Signed   By: Lupita RaiderJames  Green Jr M.D.   On: 10/17/2018 12:52   Ct Maxillofacial  Wo Contrast  Result Date: 10/17/2018 CLINICAL DATA:  Facial injury after fall. EXAM: CT HEAD WITHOUT CONTRAST CT MAXILLOFACIAL WITHOUT CONTRAST TECHNIQUE: Multidetector CT imaging of the head and maxillofacial structures were performed using the standard protocol without intravenous contrast. Multiplanar CT image reconstructions of the maxillofacial structures were also generated. COMPARISON:  None. FINDINGS: CT HEAD FINDINGS Brain: No evidence of acute infarction, hemorrhage, hydrocephalus, extra-axial collection or mass lesion/mass effect. Vascular: No hyperdense vessel or unexpected calcification. Skull: Normal. Negative for fracture or focal lesion. Other: None. CT MAXILLOFACIAL FINDINGS Osseous: Nondisplaced fracture is seen involving the right parasymphyseal region of the mandible. Mildly displaced fracture is seen involving the right mandibular angle which extends to the most posterior molar. Orbits: Negative. No traumatic or inflammatory finding. Sinuses: Clear. Soft tissues: Negative. IMPRESSION: Mildly displaced right mandibular angle fracture is noted, with nondisplaced right parasymphyseal fracture of the mandible. Normal head CT. Electronically Signed   By: Lupita RaiderJames  Green Jr M.D.   On: 10/17/2018 12:52    Review of Systems  HENT: Negative.     Blood pressure 128/85, pulse 99, temperature 97.6 F (36.4 C), temperature source Axillary, resp. rate 17, last menstrual period 10/03/2018, SpO2 98 %. Physical Exam  HENT:  Min displaced mandible fracture  Neck: Normal range of motion. Neck supple.  Cardiovascular: Normal rate.  Respiratory: Effort normal.     Assessment/Plan Adm for ORIF of mandible fracture  Osborn Cohoavid Camora Tremain, MD 10/17/2018, 4:33 PM

## 2018-10-17 NOTE — ED Triage Notes (Addendum)
Patient arrived with mother.   Patient fell aproximately 1 hour ago. Patient uses a walker. Lately patient has a "curvature" in her gate. Today she bumped her walker with her foot and fell forward face first.   Patient hit her chin, cut her lower lip, and blood on left ear.   Patients mother states she may have scratched her ear but, unsure.   Patient given 0.5 mg Lorazepam at 7am.  Patient given 2nd dose of 0.5 mgLorazepam 1 hour ago.   Patient is non verbal.   Mother at bedside.

## 2018-10-17 NOTE — ED Notes (Signed)
ANCEF given to [facility] to administer in route.

## 2018-10-17 NOTE — ED Notes (Signed)
Bed: ZO10 Expected date:  Expected time:  Means of arrival:  Comments: Floor wet

## 2018-10-17 NOTE — Anesthesia Procedure Notes (Signed)
Procedure Name: Intubation Date/Time: 10/17/2018 6:08 PM Performed by: Eligha Bridegroom, CRNA Pre-anesthesia Checklist: Timeout performed, Patient being monitored, Suction available, Emergency Drugs available and Patient identified Patient Re-evaluated:Patient Re-evaluated prior to induction Oxygen Delivery Method: Circle system utilized Preoxygenation: Pre-oxygenation with 100% oxygen Induction Type: IV induction Ventilation: Mask ventilation without difficulty Grade View: Grade I Nasal Tubes: Right, Nasal prep performed, Nasal Rae and Magill forceps - small, utilized Tube size: 6.5 mm Number of attempts: 1 Airway Equipment and Method: Stylet Placement Confirmation: positive ETCO2 and breath sounds checked- equal and bilateral Secured at: 23 cm Tube secured with: Tape Dental Injury: Teeth and Oropharynx as per pre-operative assessment

## 2018-10-17 NOTE — Transfer of Care (Signed)
Immediate Anesthesia Transfer of Care Note  Patient: Savannah Ruiz  Procedure(s) Performed: OPEN REDUCTION INTERNAL FIXATION (ORIF) OF MANDIBULAR FRACTURE (N/A Face)  Patient Location: PACU  Anesthesia Type:General  Level of Consciousness: awake and alert   Airway & Oxygen Therapy: Patient Spontanous Breathing and Patient connected to face mask oxygen  Post-op Assessment: Report given to RN and Post -op Vital signs reviewed and stable  Post vital signs: Reviewed and stable  Last Vitals:  Vitals Value Taken Time  BP 119/70 10/17/18 2042  Temp    Pulse 94 10/17/18 2042  Resp 17 10/17/18 2042  SpO2 95 % 10/17/18 2042  Vitals shown include unvalidated device data.  Last Pain:  Vitals:   10/17/18 1026  TempSrc: Axillary         Complications: No apparent anesthesia complications

## 2018-10-17 NOTE — ED Notes (Signed)
Patient transported to CT with assistance by mom and this RN.

## 2018-10-17 NOTE — ED Notes (Signed)
Report given to short stay.  

## 2018-10-17 NOTE — ED Notes (Signed)
Mother at bedside.

## 2018-10-17 NOTE — Anesthesia Postprocedure Evaluation (Signed)
Anesthesia Post Note  Patient: Savannah Ruiz  Procedure(s) Performed: OPEN REDUCTION INTERNAL FIXATION (ORIF) OF MANDIBULAR FRACTURE (N/A Face)     Patient location during evaluation: PACU Anesthesia Type: General Level of consciousness: awake Pain management: pain level controlled Vital Signs Assessment: post-procedure vital signs reviewed and stable Respiratory status: spontaneous breathing, nonlabored ventilation, respiratory function stable and patient connected to nasal cannula oxygen Cardiovascular status: blood pressure returned to baseline and stable Postop Assessment: no apparent nausea or vomiting Anesthetic complications: no    Last Vitals:  Vitals:   10/17/18 2115 10/17/18 2129  BP: 124/84 (!) 123/93  Pulse: 80 92  Resp: 16 20  Temp: 36.5 C 37.2 C  SpO2: 100% 100%    Last Pain:  Vitals:   10/17/18 2129  TempSrc: Oral                 Ryan P Ellender

## 2018-10-17 NOTE — OR Nursing (Signed)
Care of patient assumed at 1905. 

## 2018-10-18 ENCOUNTER — Encounter (HOSPITAL_COMMUNITY): Payer: Self-pay | Admitting: Otolaryngology

## 2018-10-18 MED ORDER — HYDROCODONE-ACETAMINOPHEN 7.5-325 MG/15ML PO SOLN: 5.0000 mL | Freq: Four times a day (QID) | ORAL | 0 refills | Status: AC | PRN

## 2018-10-18 MED ORDER — AMOXICILLIN 250 MG/5ML PO SUSR: 250.0000 mg | Freq: Three times a day (TID) | ORAL | 0 refills | Status: DC

## 2018-10-18 NOTE — Progress Notes (Signed)
   ENT Progress Note: POD #1 s/p Procedure(s): OPEN REDUCTION INTERNAL FIXATION (ORIF) OF MANDIBULAR FRACTURE   Subjective: Stable  Objective: Vital signs in last 24 hours: Temp:  [97.7 F (36.5 C)-99 F (37.2 C)] 98.3 F (36.8 C) (07/08 1056) Pulse Rate:  [68-99] 68 (07/08 1056) Resp:  [16-20] 16 (07/08 1056) BP: (115-124)/(63-93) 117/66 (07/08 1056) SpO2:  [91 %-100 %] 100 % (07/08 1056) Weight:  [30.8 kg] 30.8 kg (07/07 1632) Weight change:  Last BM Date: 10/16/18  Intake/Output from previous day: 07/07 0701 - 07/08 0700 In: 400 [I.V.:400] Out: -  Intake/Output this shift: No intake/output data recorded.  Labs: Recent Labs    10/17/18 1522  WBC 16.3*  HGB 12.3  HCT 38.3  PLT 286   Recent Labs    10/17/18 1522  NA 141  K 3.5  CL 107  CO2 25  GLUCOSE 99  BUN 13  CALCIUM 8.5*    Studies/Results: Ct Head Wo Contrast  Result Date: 10/17/2018 CLINICAL DATA:  Facial injury after fall. EXAM: CT HEAD WITHOUT CONTRAST CT MAXILLOFACIAL WITHOUT CONTRAST TECHNIQUE: Multidetector CT imaging of the head and maxillofacial structures were performed using the standard protocol without intravenous contrast. Multiplanar CT image reconstructions of the maxillofacial structures were also generated. COMPARISON:  None. FINDINGS: CT HEAD FINDINGS Brain: No evidence of acute infarction, hemorrhage, hydrocephalus, extra-axial collection or mass lesion/mass effect. Vascular: No hyperdense vessel or unexpected calcification. Skull: Normal. Negative for fracture or focal lesion. Other: None. CT MAXILLOFACIAL FINDINGS Osseous: Nondisplaced fracture is seen involving the right parasymphyseal region of the mandible. Mildly displaced fracture is seen involving the right mandibular angle which extends to the most posterior molar. Orbits: Negative. No traumatic or inflammatory finding. Sinuses: Clear. Soft tissues: Negative. IMPRESSION: Mildly displaced right mandibular angle fracture is noted,  with nondisplaced right parasymphyseal fracture of the mandible. Normal head CT. Electronically Signed   By: Marijo Conception M.D.   On: 10/17/2018 12:52   Ct Maxillofacial Wo Contrast  Result Date: 10/17/2018 CLINICAL DATA:  Facial injury after fall. EXAM: CT HEAD WITHOUT CONTRAST CT MAXILLOFACIAL WITHOUT CONTRAST TECHNIQUE: Multidetector CT imaging of the head and maxillofacial structures were performed using the standard protocol without intravenous contrast. Multiplanar CT image reconstructions of the maxillofacial structures were also generated. COMPARISON:  None. FINDINGS: CT HEAD FINDINGS Brain: No evidence of acute infarction, hemorrhage, hydrocephalus, extra-axial collection or mass lesion/mass effect. Vascular: No hyperdense vessel or unexpected calcification. Skull: Normal. Negative for fracture or focal lesion. Other: None. CT MAXILLOFACIAL FINDINGS Osseous: Nondisplaced fracture is seen involving the right parasymphyseal region of the mandible. Mildly displaced fracture is seen involving the right mandibular angle which extends to the most posterior molar. Orbits: Negative. No traumatic or inflammatory finding. Sinuses: Clear. Soft tissues: Negative. IMPRESSION: Mildly displaced right mandibular angle fracture is noted, with nondisplaced right parasymphyseal fracture of the mandible. Normal head CT. Electronically Signed   By: Marijo Conception M.D.   On: 10/17/2018 12:52     PHYSICAL EXAM: No sig swelling Inc intact - no bleeding   Assessment/Plan: Stable postop ORIF mandible frx D/C to home    Savannah Ruiz 10/18/2018, 11:54 AM

## 2018-10-18 NOTE — Plan of Care (Signed)

## 2018-10-18 NOTE — Discharge Summary (Signed)
Physician Discharge Summary  Patient ID: Savannah Ruiz MRN: [id number] DOB/AGE: [birth year] 28 y.o.  Admit date: 10/17/2018 Discharge date: 10/18/2018  Admission Diagnoses:  Active Problems:   Mandible fracture ([place])   Mandible open fracture [facility])   Discharge Diagnoses:  Same  Surgeries: Procedure(s): OPEN REDUCTION INTERNAL FIXATION (ORIF) OF MANDIBULAR FRACTURE on 10/17/2018   Consultants: None  Discharged Condition: Improved  Hospital Course: Savannah Ruiz is an 27 y.o. female who was admitted 10/17/2018 with a diagnosis of Active Problems:   Mandible fracture ([place])   Mandible open fracture ([place])  and went to the operating room on 10/17/2018 and underwent the above named procedures.   Repair of mandible fracture on 7/7. Patient d/c'ed to home on 7/8.  Recent vital signs:  Vitals:   10/18/18 0806 10/18/18 1056  BP: 115/63 117/66  Pulse: 76 68  Resp: 19 16  Temp: 98.3 F (36.8 C) 98.3 F (36.8 C)  SpO2:  100%    Recent laboratory studies:  Results for orders placed or performed during the hospital encounter of 10/17/18  SARS Coronavirus 2 (CEPHEID - Performed in [place] hospital lab), [facility] Order   Specimen: Nasopharyngeal Swab  Result Value Ref Range   SARS Coronavirus 2 NEGATIVE NEGATIVE  CBC with Differential  Result Value Ref Range   WBC 16.3 (H) 4.0 - 10.5 K/uL   RBC 3.96 3.87 - 5.11 MIL/uL   Hemoglobin 12.3 12.0 - 15.0 g/dL   HCT 38.3 36.0 - 46.0 %   MCV 96.7 80.0 - 100.0 fL   MCH 31.1 26.0 - 34.0 pg   MCHC 32.1 30.0 - 36.0 g/dL   RDW 12.3 11.5 - 15.5 %   Platelets 286 150 - 400 K/uL   nRBC 0.0 0.0 - 0.2 %   Neutrophils Relative % 81 %   Neutro Abs 12.9 (H) 1.7 - 7.7 K/uL   Lymphocytes Relative 10 %   Lymphs Abs 1.7 0.7 - 4.0 K/uL   Monocytes Relative 9 %   Monocytes Absolute 1.5 (H) 0.1 - 1.0 K/uL   Eosinophils Relative 0 %   Eosinophils Absolute 0.0 0.0 - 0.5 K/uL   Basophils Relative 0 %   Basophils Absolute 0.1 0.0 - 0.1 K/uL   Immature  Granulocytes 0 %   Abs Immature Granulocytes 0.07 0.00 - 0.07 K/uL  Comprehensive metabolic panel  Result Value Ref Range   Sodium 141 135 - 145 mmol/L   Potassium 3.5 3.5 - 5.1 mmol/L   Chloride 107 98 - 111 mmol/L   CO2 25 22 - 32 mmol/L   Glucose, Bld 99 70 - 99 mg/dL   BUN 13 6 - 20 mg/dL   Creatinine, Ser 0.34 (L) 0.44 - 1.00 mg/dL   Calcium 8.5 (L) 8.9 - 10.3 mg/dL   Total Protein 7.0 6.5 - 8.1 g/dL   Albumin 4.0 3.5 - 5.0 g/dL   AST 35 15 - 41 U/L   ALT 41 0 - 44 U/L   Alkaline Phosphatase 82 38 - 126 U/L   Total Bilirubin 0.2 (L) 0.3 - 1.2 mg/dL   GFR calc non Af Amer >60 >60 mL/min   GFR calc Af Amer >60 >60 mL/min   Anion gap 9 5 - 15    Discharge Medications:   Allergies as of 10/18/2018   No Known Allergies     Medication List    TAKE these medications   amoxicillin 250 MG/5ML suspension Commonly known as: AMOXIL Take 5 mLs (250 mg total)  by mouth 3 (three) times daily.   HYDROcodone-acetaminophen 7.5-325 mg/15 ml solution Commonly known as: HYCET Take 5 mLs by mouth every 6 (six) hours as needed for up to 5 days for moderate pain. Alternate OTC Motrin 200mg  and Tylenol 300mg  every 6 hours as needed for pain. May substitute Hycet (Tylenol/narcotic) for plain Tylenol as needed.   ibuprofen 200 MG tablet Commonly known as: ADVIL Take 200 mg by mouth every 6 (six) hours as needed for mild pain.   LORazepam 0.5 MG tablet Commonly known as: ATIVAN Take 0.5 mg by mouth daily as needed for anxiety.   omeprazole 20 MG capsule Commonly known as: PRILOSEC Take 20 mg by mouth daily.       Diagnostic Studies: Ct Head Wo Contrast  Result Date: 10/17/2018 CLINICAL DATA:  Facial injury after fall. EXAM: CT HEAD WITHOUT CONTRAST CT MAXILLOFACIAL WITHOUT CONTRAST TECHNIQUE: Multidetector CT imaging of the head and maxillofacial structures were performed using the standard protocol without intravenous contrast. Multiplanar CT image reconstructions of the maxillofacial  structures were also generated. COMPARISON:  None. FINDINGS: CT HEAD FINDINGS Brain: No evidence of acute infarction, hemorrhage, hydrocephalus, extra-axial collection or mass lesion/mass effect. Vascular: No hyperdense vessel or unexpected calcification. Skull: Normal. Negative for fracture or focal lesion. Other: None. CT MAXILLOFACIAL FINDINGS Osseous: Nondisplaced fracture is seen involving the right parasymphyseal region of the mandible. Mildly displaced fracture is seen involving the right mandibular angle which extends to the most posterior molar. Orbits: Negative. No traumatic or inflammatory finding. Sinuses: Clear. Soft tissues: Negative. IMPRESSION: Mildly displaced right mandibular angle fracture is noted, with nondisplaced right parasymphyseal fracture of the mandible. Normal head CT. Electronically Signed   By: Lupita RaiderJames  Green Jr M.D.   On: 10/17/2018 12:52   Ct Maxillofacial Wo Contrast  Result Date: 10/17/2018 CLINICAL DATA:  Facial injury after fall. EXAM: CT HEAD WITHOUT CONTRAST CT MAXILLOFACIAL WITHOUT CONTRAST TECHNIQUE: Multidetector CT imaging of the head and maxillofacial structures were performed using the standard protocol without intravenous contrast. Multiplanar CT image reconstructions of the maxillofacial structures were also generated. COMPARISON:  None. FINDINGS: CT HEAD FINDINGS Brain: No evidence of acute infarction, hemorrhage, hydrocephalus, extra-axial collection or mass lesion/mass effect. Vascular: No hyperdense vessel or unexpected calcification. Skull: Normal. Negative for fracture or focal lesion. Other: None. CT MAXILLOFACIAL FINDINGS Osseous: Nondisplaced fracture is seen involving the right parasymphyseal region of the mandible. Mildly displaced fracture is seen involving the right mandibular angle which extends to the most posterior molar. Orbits: Negative. No traumatic or inflammatory finding. Sinuses: Clear. Soft tissues: Negative. IMPRESSION: Mildly displaced right  mandibular angle fracture is noted, with nondisplaced right parasymphyseal fracture of the mandible. Normal head CT. Electronically Signed   By: Lupita RaiderJames  Green Jr M.D.   On: 10/17/2018 12:52    Disposition: Discharge disposition: 01-Home or Self Care       Discharge Instructions    Diet - low sodium heart healthy   Complete by: As directed    Discharge instructions   Complete by: As directed    Jaw Fracture Instructions:  1. Limited activity 2. Liquid and soft diet only for 2 wks, then gradually increase diet 3. May bathe and shower 4. Avoid any jaw trauma 5. Elevate Head of Bed 6. Ice compress to jaw 7. Rinse mouth with water twice daily 8. Alternate Motrin 200mg  with Tylenol 300 mg or Hycet elixir every 6 hrs   Increase activity slowly   Complete by: As directed  Follow-up Information    Osborn CohoShoemaker, Emersyn Kotarski, MD. Schedule an appointment as soon as possible for a visit in 1 week.   Specialty: Otolaryngology Contact information: [address] [place] [address] [phone]            Signed: Osborn CohoDavid Sameria Morss 10/18/2018, 12:03 PM

## 2018-10-18 NOTE — Progress Notes (Signed)
Pt alert, mom at bedside. Pt non-verbal. Up ambulating in hallway with assistance of mom. Ready for discharge.

## 2019-06-14 ENCOUNTER — Ambulatory Visit: Payer: Self-pay | Admitting: Dentistry

## 2019-06-15 NOTE — Progress Notes (Signed)
[id number] [place], [place] - [address] [place] [address] Phone: [phone] Fax: [phone]      Your procedure is scheduled on Thursday June 21, 2019.  Report to [facility] Main Entrance "A" at 08:00 A.M., and check in at the Admitting office.  Call this number if you have problems the morning of surgery:  [phone]  Call [phone] if you have any questions prior to your surgery date Monday-Friday 8am-4pm    Remember:  Do not eat or drink after midnight the night before your surgery    Take these medicines the morning of surgery with A SIP OF WATER: LORazepam (ATIVAN) - as needed omeprazole (PRILOSEC)   As of today, STOP taking any Aspirin (unless otherwise instructed by your surgeon), Aleve, Naproxen, Ibuprofen, Motrin, Advil, Goody's, BC's, all herbal medications, fish oil, and all vitamins.    The Morning of Surgery  Do not wear jewelry, make-up or nail polish.  Do not wear lotions, powders, perfumes, or deodorant  Do not shave 48 hours prior to surgery.    Do not bring valuables to the hospital.  [facility] is not responsible for any belongings or valuables.  If you are a smoker, DO NOT Smoke 24 hours prior to surgery  If you wear a CPAP at night please bring your mask the morning of surgery   Remember that you must have someone to transport you home after your surgery, and remain with you for 24 hours if you are discharged the same day.   Please bring cases for contacts, glasses, hearing aids, dentures or bridgework because it cannot be worn into surgery.    Leave your suitcase in the car.  After surgery it may be brought to your room.  For patients admitted to the hospital, discharge time will be determined by your treatment team.  Patients discharged the day of surgery will not be allowed to drive home.    Special instructions:   [facility]- Preparing For Surgery  Before surgery, you can play an  important role. Because skin is not sterile, your skin needs to be as free of germs as possible. You can reduce the number of germs on your skin by washing with CHG (chlorahexidine gluconate) Soap before surgery.  CHG is an antiseptic cleaner which kills germs and bonds with the skin to continue killing germs even after washing.    Oral Hygiene is also important to reduce your risk of infection.  Remember - BRUSH YOUR TEETH THE MORNING OF SURGERY WITH YOUR REGULAR TOOTHPASTE  Please do not use if you have an allergy to CHG or antibacterial soaps. If your skin becomes reddened/irritated stop using the CHG.  Do not shave (including legs and underarms) for at least 48 hours prior to first CHG shower. It is OK to shave your face.  Please follow these instructions carefully.   1. Shower the NIGHT BEFORE SURGERY and the MORNING OF SURGERY with CHG Soap.   2. If you chose to wash your hair, wash your hair first as usual with your normal shampoo.  3. After you shampoo, rinse your hair and body thoroughly to remove the shampoo.  4. Use CHG as you would any other liquid soap. You can apply CHG directly to the skin and wash gently with a scrungie or a clean washcloth.   5. Apply the CHG Soap to your body ONLY FROM THE NECK DOWN.  Do not use on open wounds or open sores. Avoid  contact with your eyes, ears, mouth and genitals (private parts). Wash Face and genitals (private parts)  with your normal soap.   6. Wash thoroughly, paying special attention to the area where your surgery will be performed.  7. Thoroughly rinse your body with warm water from the neck down.  8. DO NOT shower/wash with your normal soap after using and rinsing off the CHG Soap.  9. Pat yourself dry with a CLEAN TOWEL.  10. Wear CLEAN PAJAMAS to bed the night before surgery, wear comfortable clothes the morning of surgery  11. Place CLEAN SHEETS on your bed the night of your first shower and DO NOT SLEEP WITH PETS.    Day of  Surgery:  Please shower the morning of surgery with the CHG soap Do not apply any deodorants/lotions. Please wear clean clothes to the hospital/surgery center.   Remember to brush your teeth WITH YOUR REGULAR TOOTHPASTE.   Please read over the following fact sheets that you were given.

## 2019-06-18 ENCOUNTER — Other Ambulatory Visit (HOSPITAL_COMMUNITY)
Admission: RE | Admit: 2019-06-18 | Discharge: 2019-06-18 | Disposition: A | Discharge status: Home / Self Care | Payer: Medicaid Other | Source: Ambulatory Visit | Attending: Dentistry | Admitting: Dentistry

## 2019-06-18 ENCOUNTER — Encounter (HOSPITAL_COMMUNITY)
Admission: RE | Admit: 2019-06-18 | Discharge: 2019-06-18 | Disposition: A | Discharge status: Home / Self Care | Payer: Medicaid Other | Source: Ambulatory Visit | Attending: Dentistry | Admitting: Dentistry

## 2019-06-18 ENCOUNTER — Encounter (HOSPITAL_COMMUNITY): Payer: Self-pay

## 2019-06-18 ENCOUNTER — Other Ambulatory Visit: Payer: Self-pay

## 2019-06-18 DIAGNOSIS — Z01812 Encounter for preprocedural laboratory examination: Secondary | ICD-10-CM | POA: Diagnosis present

## 2019-06-18 DIAGNOSIS — Z20822 Contact with and (suspected) exposure to covid-19: Secondary | ICD-10-CM | POA: Diagnosis not present

## 2019-06-18 HISTORY — DX: Cerebral palsy, unspecified: G80.9

## 2019-06-18 HISTORY — DX: Gastro-esophageal reflux disease without esophagitis: K21.9

## 2019-06-18 HISTORY — DX: Congenital malformations of corpus callosum: Q04.0

## 2019-06-18 LAB — SARS CORONAVIRUS 2 (TAT 6-24 HRS): SARS Coronavirus 2: NEGATIVE

## 2019-06-18 NOTE — Progress Notes (Signed)
PCP - Benny Lennert, PA with [facility] Cardiologist - n/a  PPM/ICD - n/a Device Orders -  Rep Notified -   Chest x-ray - n/a EKG - n/a Stress Test - n/a ECHO - n/a Cardiac Cath - n/a  Sleep Study - n/a CPAP -   Fasting Blood Sugar - n/a Checks Blood Sugar _____ times a day  Blood Thinner Instructions: n/a Aspirin Instructions:  ERAS Protcol - n/a PRE-SURGERY Ensure or G2-   COVID TEST- 06/18/2019 after PAT appointment   Anesthesia review: yes, history of CP.  Unable to obtain VS at PAT appointment.  Last BP obtained was 06/11/19 at PCP's office 110/78, temp of 97.3.  Spoke with Fayrene Fearing regarding labs because mother states it will be hard to get labs without sedation.  No need to get labs at PAT appointment per Fayrene Fearing.  Patient denies shortness of breath, fever, cough and chest pain at PAT appointment   All instructions explained to the patient, with a verbal understanding of the material. Patient agrees to go over the instructions while at home for a better understanding. Patient also instructed to self quarantine after being tested for COVID-19. The opportunity to ask questions was provided.

## 2019-06-19 NOTE — Anesthesia Preprocedure Evaluation (Addendum)
Anesthesia Evaluation  Patient identified by MRN, date of birth, ID band Patient awake and Patient confused    Reviewed: Allergy & Precautions, NPO status , Patient's Chart, lab work & pertinent test results  History of Anesthesia Complications Negative for: history of anesthetic complications  Airway   TM Distance: >3 FB Neck ROM: Full   Comment:  Unable to perform airway evaluation  Dental  (+) Dental Advisory Given, Poor Dentition   Pulmonary neg pulmonary ROS,    Pulmonary exam normal        Cardiovascular negative cardio ROS Normal cardiovascular exam     Neuro/Psych  Cerebral palsy Nonverbal Ambulates with walker occasionally, otherwise uses wheelchair Agenesis of corpus callosum  negative psych ROS   GI/Hepatic Neg liver ROS, GERD  Medicated and Controlled,  Endo/Other  negative endocrine ROS  Renal/GU negative Renal ROS     Musculoskeletal negative musculoskeletal ROS (+)   Abdominal   Peds  Hematology negative hematology ROS (+)   Anesthesia Other Findings Covid neg 06/18/19 Had preop exam by PCP Benny Lennert, PA-C 06/11/19. Per note, "I see no reason patient is at increased risk for anesthesia for dental care"   Reproductive/Obstetrics                          Anesthesia Physical Anesthesia Plan  ASA: III  Anesthesia Plan: General   Post-op Pain Management:    Induction: Intravenous  PONV Risk Score and Plan: 3 and Treatment may vary due to age or medical condition, Ondansetron, Midazolam and Dexamethasone  Airway Management Planned: Nasal ETT and Video Laryngoscope Planned  Additional Equipment: None  Intra-op Plan:   Post-operative Plan: Extubation in OR  Informed Consent: I have reviewed the patients History and Physical, chart, labs and discussed the procedure including the risks, benefits and alternatives for the proposed anesthesia with the patient or  authorized representative who has indicated his/her understanding and acceptance.     Dental advisory given and Consent reviewed with POA  Plan Discussed with: CRNA and Anesthesiologist  Anesthesia Plan Comments: (Able to place right nasal 6.51mm ETT in 10/2018 with aid of glidescope, will attempt the same)   Anesthesia Quick Evaluation

## 2019-06-21 ENCOUNTER — Ambulatory Visit (HOSPITAL_COMMUNITY): Payer: Medicaid Other | Admitting: Physician Assistant

## 2019-06-21 ENCOUNTER — Encounter (HOSPITAL_COMMUNITY): Payer: Self-pay

## 2019-06-21 ENCOUNTER — Encounter (HOSPITAL_COMMUNITY)
Admission: RE | Disposition: A | Discharge status: Home / Self Care | Payer: Self-pay | Source: Home / Self Care | Attending: Dentistry

## 2019-06-21 ENCOUNTER — Ambulatory Visit (HOSPITAL_COMMUNITY): Payer: Medicaid Other | Admitting: Anesthesiology

## 2019-06-21 ENCOUNTER — Ambulatory Visit (HOSPITAL_COMMUNITY)
Admission: RE | Admit: 2019-06-21 | Discharge: 2019-06-21 | Disposition: A | Discharge status: Home / Self Care | Payer: Medicaid Other | Attending: Dentistry | Admitting: Dentistry

## 2019-06-21 ENCOUNTER — Other Ambulatory Visit: Payer: Self-pay

## 2019-06-21 DIAGNOSIS — K029 Dental caries, unspecified: Secondary | ICD-10-CM | POA: Insufficient documentation

## 2019-06-21 DIAGNOSIS — K219 Gastro-esophageal reflux disease without esophagitis: Secondary | ICD-10-CM | POA: Diagnosis not present

## 2019-06-21 DIAGNOSIS — G809 Cerebral palsy, unspecified: Secondary | ICD-10-CM | POA: Diagnosis not present

## 2019-06-21 DIAGNOSIS — F79 Unspecified intellectual disabilities: Secondary | ICD-10-CM | POA: Diagnosis not present

## 2019-06-21 HISTORY — PX: DENTAL RESTORATION/EXTRACTION WITH X-RAY: SHX5796

## 2019-06-21 SURGERY — DENTAL RESTORATION/EXTRACTION WITH X-RAY
Anesthesia: General | Site: Mouth

## 2019-06-21 MED ORDER — OXYCODONE HCL 5 MG PO TABS: 5.0000 mg | ORAL_TABLET | Freq: Once | ORAL | Status: DC | PRN

## 2019-06-21 MED ORDER — ONDANSETRON HCL 4 MG/2ML IJ SOLN
INTRAMUSCULAR | Status: DC | PRN
  Administered 2019-06-21: 3 mg via INTRAVENOUS

## 2019-06-21 MED ORDER — PROPOFOL 10 MG/ML IV BOLUS
INTRAVENOUS | Status: AC
  Filled 2019-06-21: qty 20

## 2019-06-21 MED ORDER — OXYCODONE HCL 5 MG/5ML PO SOLN: 5.0000 mg | Freq: Once | ORAL | Status: DC | PRN

## 2019-06-21 MED ORDER — ROCURONIUM BROMIDE 10 MG/ML (PF) SYRINGE
PREFILLED_SYRINGE | INTRAVENOUS | Status: AC
  Filled 2019-06-21: qty 10

## 2019-06-21 MED ORDER — DEXAMETHASONE SODIUM PHOSPHATE 10 MG/ML IJ SOLN
INTRAMUSCULAR | Status: DC | PRN
  Administered 2019-06-21: 3 mg via INTRAVENOUS

## 2019-06-21 MED ORDER — LIDOCAINE HCL (CARDIAC) PF 100 MG/5ML IV SOSY
PREFILLED_SYRINGE | INTRAVENOUS | Status: DC | PRN
  Administered 2019-06-21: 30 mg via INTRATRACHEAL

## 2019-06-21 MED ORDER — BACITRACIN ZINC 500 UNIT/GM EX OINT
TOPICAL_OINTMENT | CUTANEOUS | Status: DC | PRN
  Administered 2019-06-21: 1 via TOPICAL

## 2019-06-21 MED ORDER — PHENYLEPHRINE HCL-NACL 10-0.9 MG/250ML-% IV SOLN
INTRAVENOUS | Status: DC | PRN
  Administered 2019-06-21: 10 ug/min via INTRAVENOUS

## 2019-06-21 MED ORDER — HEMOSTATIC AGENTS (NO CHARGE) OPTIME
TOPICAL | Status: DC | PRN
  Administered 2019-06-21: 1 via TOPICAL

## 2019-06-21 MED ORDER — SUGAMMADEX SODIUM 200 MG/2ML IV SOLN
INTRAVENOUS | Status: DC | PRN
  Administered 2019-06-21 (×2): 50 mg via INTRAVENOUS

## 2019-06-21 MED ORDER — OXYMETAZOLINE HCL 0.05 % NA SOLN
NASAL | Status: AC
  Filled 2019-06-21: qty 30

## 2019-06-21 MED ORDER — OXYMETAZOLINE HCL 0.05 % NA SOLN
NASAL | Status: DC | PRN
  Administered 2019-06-21: 1

## 2019-06-21 MED ORDER — LIDOCAINE 2% (20 MG/ML) 5 ML SYRINGE
INTRAMUSCULAR | Status: AC
  Filled 2019-06-21: qty 5

## 2019-06-21 MED ORDER — FENTANYL CITRATE (PF) 100 MCG/2ML IJ SOLN: 25.0000 ug | INTRAMUSCULAR | Status: DC | PRN

## 2019-06-21 MED ORDER — BACITRACIN ZINC 500 UNIT/GM EX OINT
TOPICAL_OINTMENT | CUTANEOUS | Status: AC
  Filled 2019-06-21: qty 28.35

## 2019-06-21 MED ORDER — OXYMETAZOLINE HCL 0.05 % NA SOLN
NASAL | Status: DC | PRN
  Administered 2019-06-21: 2 via NASAL

## 2019-06-21 MED ORDER — CHLORHEXIDINE GLUCONATE 0.12 % MT SOLN
OROMUCOSAL | Status: DC | PRN
  Administered 2019-06-21: 5 mL via OROMUCOSAL

## 2019-06-21 MED ORDER — MIDAZOLAM HCL 2 MG/ML PO SYRP
8.0000 mg | ORAL_SOLUTION | Freq: Once | ORAL | Status: AC
  Administered 2019-06-21: 8 mg via ORAL
  Filled 2019-06-21: qty 4

## 2019-06-21 MED ORDER — FENTANYL CITRATE (PF) 250 MCG/5ML IJ SOLN
INTRAMUSCULAR | Status: DC | PRN
  Administered 2019-06-21: 50 ug via INTRAVENOUS

## 2019-06-21 MED ORDER — MIDAZOLAM HCL 2 MG/2ML IJ SOLN
INTRAMUSCULAR | Status: AC
  Filled 2019-06-21: qty 2

## 2019-06-21 MED ORDER — ONDANSETRON HCL 4 MG/2ML IJ SOLN: 4.0000 mg | Freq: Once | INTRAMUSCULAR | Status: DC | PRN

## 2019-06-21 MED ORDER — LIDOCAINE-EPINEPHRINE 2 %-1:100000 IJ SOLN
INTRAMUSCULAR | Status: AC
  Filled 2019-06-21: qty 3.4

## 2019-06-21 MED ORDER — LACTATED RINGERS IV SOLN: INTRAVENOUS | Status: DC

## 2019-06-21 MED ORDER — LIDOCAINE-EPINEPHRINE 2 %-1:100000 IJ SOLN
INTRAMUSCULAR | Status: DC | PRN
  Administered 2019-06-21: 8.5 mL via INTRADERMAL

## 2019-06-21 MED ORDER — PROPOFOL 10 MG/ML IV BOLUS
INTRAVENOUS | Status: DC | PRN
  Administered 2019-06-21: 70 mg via INTRAVENOUS

## 2019-06-21 MED ORDER — EPHEDRINE SULFATE-NACL 50-0.9 MG/10ML-% IV SOSY
PREFILLED_SYRINGE | INTRAVENOUS | Status: DC | PRN
  Administered 2019-06-21 (×2): 5 mg via INTRAVENOUS

## 2019-06-21 MED ORDER — FENTANYL CITRATE (PF) 250 MCG/5ML IJ SOLN
INTRAMUSCULAR | Status: AC
  Filled 2019-06-21: qty 5

## 2019-06-21 MED ORDER — CHLORHEXIDINE GLUCONATE 0.12 % MT SOLN
OROMUCOSAL | Status: AC
  Filled 2019-06-21: qty 15

## 2019-06-21 MED ORDER — ROCURONIUM 10MG/ML (10ML) SYRINGE FOR MEDFUSION PUMP - OPTIME
INTRAVENOUS | Status: DC | PRN
  Administered 2019-06-21: 30 mg via INTRAVENOUS

## 2019-06-21 MED ORDER — DEXAMETHASONE SODIUM PHOSPHATE 10 MG/ML IJ SOLN
INTRAMUSCULAR | Status: AC
  Filled 2019-06-21: qty 1

## 2019-06-21 MED ORDER — ONDANSETRON HCL 4 MG/2ML IJ SOLN
INTRAMUSCULAR | Status: AC
  Filled 2019-06-21: qty 2

## 2019-06-21 MED ORDER — BUPIVACAINE-EPINEPHRINE (PF) 0.5% -1:200000 IJ SOLN
INTRAMUSCULAR | Status: AC
  Filled 2019-06-21: qty 3.6

## 2019-06-21 SURGICAL SUPPLY — 44 items
BLADE SURG 15 STRL LF DISP TIS (BLADE) IMPLANT
BLADE SURG 15 STRL SS (BLADE) ×3
CANISTER SUCT 3000ML PPV (MISCELLANEOUS) ×3 IMPLANT
COUNTER NEEDLE 20 DBL MAG RED (NEEDLE) ×2 IMPLANT
COVER BACK TABLE 60X90IN (DRAPES) ×3 IMPLANT
COVER MAYO STAND STRL (DRAPES) ×3 IMPLANT
COVER SURGICAL LIGHT HANDLE (MISCELLANEOUS) ×3 IMPLANT
COVER WAND RF STERILE (DRAPES) ×3 IMPLANT
DECANTER SPIKE VIAL GLASS SM (MISCELLANEOUS) IMPLANT
DRAPE HALF SHEET 40X57 (DRAPES) ×3 IMPLANT
ELECT REM PT RETURN 9FT ADLT (ELECTROSURGICAL)
ELECTRODE REM PT RTRN 9FT ADLT (ELECTROSURGICAL) IMPLANT
GAUZE 4X4 16PLY RFD (DISPOSABLE) ×3 IMPLANT
GAUZE PACKING FOLDED 2  STR (GAUZE/BANDAGES/DRESSINGS) ×3
GAUZE PACKING FOLDED 2 STR (GAUZE/BANDAGES/DRESSINGS) ×1 IMPLANT
GAUZE SPONGE 2X2 8PLY STRL LF (GAUZE/BANDAGES/DRESSINGS) IMPLANT
GLOVE SURG SS PI 8.0 STRL IVOR (GLOVE) ×2 IMPLANT
GOWN STRL REIN XL XLG (GOWN DISPOSABLE) ×2 IMPLANT
GOWN STRL REUS W/ TWL LRG LVL3 (GOWN DISPOSABLE) ×2 IMPLANT
GOWN STRL REUS W/TWL LRG LVL3 (GOWN DISPOSABLE) ×6
KIT BASIN OR (CUSTOM PROCEDURE TRAY) ×3 IMPLANT
KIT TURNOVER KIT B (KITS) ×3 IMPLANT
MANIFOLD NEPTUNE II (INSTRUMENTS) ×3 IMPLANT
NDL PRECISIONGLIDE 27X1.5 (NEEDLE) IMPLANT
NEEDLE PRECISIONGLIDE 27X1.5 (NEEDLE) ×3 IMPLANT
NS IRRIG 1000ML POUR BTL (IV SOLUTION) ×3 IMPLANT
PAD ARMBOARD 7.5X6 YLW CONV (MISCELLANEOUS) ×6 IMPLANT
PENCIL BUTTON HOLSTER BLD 10FT (ELECTRODE) IMPLANT
SOL ANTI FOG 6CC (MISCELLANEOUS) ×1 IMPLANT
SOLUTION ANTI FOG 6CC (MISCELLANEOUS) ×2
SPONGE GAUZE 2X2 STER 10/PKG (GAUZE/BANDAGES/DRESSINGS)
SPONGE SURGIFOAM ABS GEL 12-7 (HEMOSTASIS) IMPLANT
SPONGE SURGIFOAM ABS GEL SZ50 (HEMOSTASIS) IMPLANT
SUT CHROMIC 3 0 PS 2 (SUTURE) ×4 IMPLANT
SYR BULB 3OZ (MISCELLANEOUS) ×2 IMPLANT
SYR CONTROL 10ML LL (SYRINGE) IMPLANT
TOOTHBRUSH ADULT (PERSONAL CARE ITEMS) ×2 IMPLANT
TOWEL GREEN STERILE (TOWEL DISPOSABLE) ×3 IMPLANT
TOWEL GREEN STERILE FF (TOWEL DISPOSABLE) ×2 IMPLANT
TUBE CONNECTING 12'X1/4 (SUCTIONS) ×1
TUBE CONNECTING 12X1/4 (SUCTIONS) ×2 IMPLANT
WATER STERILE IRR 1000ML POUR (IV SOLUTION) ×6 IMPLANT
WATER TABLETS ICX (MISCELLANEOUS) ×2 IMPLANT
YANKAUER SUCT BULB TIP NO VENT (SUCTIONS) ×3 IMPLANT

## 2019-06-21 NOTE — Transfer of Care (Signed)
Immediate Anesthesia Transfer of Care Note  Patient: Savannah Ruiz  Procedure(s) Performed: DENTAL RESTORATION/EXTRACTION OF TEETH 2, 10, 16, 17, 30, 31, 32, WITH Diagnostic X-RAY, Cleaning, Fillings TEETH 3, 15 (N/A Mouth)  Patient Location: PACU  Anesthesia Type:General  Level of Consciousness: drowsy  Airway & Oxygen Therapy: Patient Spontanous Breathing and Patient connected to face mask oxygen  Post-op Assessment: Report given to RN and Post -op Vital signs reviewed and stable  Post vital signs: Reviewed and stable  Last Vitals:  Vitals Value Taken Time  BP 122/72 06/21/19 1353  Temp    Pulse 69 06/21/19 1353  Resp 13 06/21/19 1353  SpO2 100 % 06/21/19 1353  Vitals shown include unvalidated device data.  Last Pain: There were no vitals filed for this visit.       Complications: No apparent anesthesia complications

## 2019-06-21 NOTE — Anesthesia Procedure Notes (Signed)
Procedure Name: Intubation Date/Time: 06/21/2019 11:09 AM Performed by: Ezekiel Ina, CRNA Pre-anesthesia Checklist: Patient identified, Emergency Drugs available, Suction available and Patient being monitored Patient Re-evaluated:Patient Re-evaluated prior to induction Oxygen Delivery Method: Circle System Utilized Preoxygenation: Pre-oxygenation with 100% oxygen Induction Type: IV induction Ventilation: Mask ventilation without difficulty Laryngoscope Size: Glidescope and 3 Grade View: Grade I Nasal Tubes: Nasal Rae and Nasal prep performed Tube size: 6.0 mm Number of attempts: 1 Placement Confirmation: ETT inserted through vocal cords under direct vision,  positive ETCO2 and breath sounds checked- equal and bilateral Secured at: 24 cm Tube secured with: Tape Dental Injury: Teeth and Oropharynx as per pre-operative assessment  Comments: Performed by [place] Ingram SRNA

## 2019-06-21 NOTE — Anesthesia Postprocedure Evaluation (Signed)
Anesthesia Post Note  Patient: Savannah Ruiz  Procedure(s) Performed: DENTAL RESTORATION/EXTRACTION OF TEETH 2, 10, 16, 17, 30, 31, 32, WITH Diagnostic X-RAY, Cleaning, Fillings TEETH 3, 15 (N/A Mouth)     Patient location during evaluation: PACU Anesthesia Type: General Level of consciousness: awake (as in preop) Pain management: pain level controlled Vital Signs Assessment: post-procedure vital signs reviewed and stable Respiratory status: spontaneous breathing, nonlabored ventilation and respiratory function stable Cardiovascular status: blood pressure returned to baseline and stable Postop Assessment: no apparent nausea or vomiting Anesthetic complications: no    Last Vitals:  Vitals:   06/21/19 1408 06/21/19 1429  BP: 106/63 104/75  Pulse: 75 79  Resp: 14 15  Temp:  36.8 C  SpO2: 95% 96%    Last Pain:  Vitals:   06/21/19 1429  PainSc: Asleep                 Beryle Lathe

## 2019-06-21 NOTE — Op Note (Signed)
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 Dental Care provided in OR for medically necessary treatment.  Surgeon: Cristino Martes, DMD  Assistant: Rudi Coco, [place] and hospital staff.  Anesthesia: General  Procedure: The patient was brought into the operating room and placed on the table in a supine position.  General anesthesia was administered via nasal intubation.  The patient was prepped and draped in the usual manner for an intra-oral general dentistry procedure. The oropharynx was suctioned and a moistened oropharyngeal throat pack was placed.    A full intra-oral exam including all hard and soft tissues was performed.  Type of Exam: Comprehensive   Soft Tissue Exam: Floor of the mouth: Normal Buccal mucosa: Normal Soft palate: Normal Hard palate: Normal Tongue: Normal Gingival: Buccal parulis/Draining fistula apically-adjacent to teeth #s 10 and 30 Frenum: Normal  Hard tissue exam:  Present: # 2-23, #26-32 Missing: # 24 and #25 Un-erupted: N/A Radiographic findings decay: #2, #3, #10, #15, #16, #19, #31 Radiographic findings abscess: N/A  Full mouth series of digital radiographs taken and reviewed. A comprehensive treatment plan was developed.  Operative care was accomplished in a standard fashion using high/low speed drills with copious irrigation.  Routine extractions were accomplished with simple elevation and use of forceps.  Alveoplasty:No  All surgical sites were irrigated with copious amounts of saline. Gel foam was placed in the sockets and hemostasis established with firm pressure. Surgical sites were closed with 3-0 Chromic sutures.  Local Anes:Lidocaine 2% with 1:100,000 epinephrine 6.8 mls The estimated blood loss was 15 mls.   Upon completion of all procedures the oropharynx was irrigated of all debris. Mouth  was suctioned dry and a posterior throat pack was carefully removed with constant suction. Hemostasis was established and a gauze pack was placed as an intraoral pressure dressing. After spontaneous respirations the patient was extubated and transported to the Post-Anesthesia care unit in awake but in a sedated condition. The patient tolerated the procedure well and without complications.  An explanation of procedures and extractions were given to Vella Raring.  Operative Procedures:  Full mouth debridement: Yes Extractions completed: # 2, #10, #16, #17, #30, #31, #32 Amalgams: N/A Composites/Glass Ionomers: #19 Fluoride varnish: Yes Gingivectomy: No Alveloplasty: No Crown: N/A Bridge: N/A  Postoperative Meds:  Patient should continue normal pain management regimen OR alternate between Ibuprofen (3 X 200 mg) and Tylenol (2 X 325 mg) every 6 hrs Prn pain.   Postoperative Instructions: Extraction sheet signed and given to patient representative.    Cristino Martes, DMD

## 2019-06-21 NOTE — H&P (Signed)
Savannah Ruiz is an 29 y.o. female.   Chief Complaint: LR cavity. Unable to tolerate exam, imaging and/or procedures in an outpatient setting HPI:   Past Medical History:  Diagnosis Date  . Agenesis of corpus callosum (HCC)   . CP (cerebral palsy) (HCC)   . GERD (gastroesophageal reflux disease)     Past Surgical History:  Procedure Laterality Date  . ANKLE SURGERY  2003-2004  . EYE SURGERY    . MOUTH SURGERY    . ORIF MANDIBULAR FRACTURE N/A 10/17/2018   Procedure: OPEN REDUCTION INTERNAL FIXATION (ORIF) OF MANDIBULAR FRACTURE;  Surgeon: Osborn Coho, MD;  Location: [facility] OR;  Service: ENT;  Laterality: N/A;    History reviewed. No pertinent family history. Social History:  reports that she has never smoked. She has never used smokeless tobacco. She reports that she does not drink alcohol or use drugs.  Allergies: No Known Allergies  Medications Prior to Admission  Medication Sig Dispense Refill  . ibuprofen (ADVIL) 200 MG tablet Take 200-400 mg by mouth every 6 (six) hours as needed for mild pain.     Marland Kitchen LORazepam (ATIVAN) 0.5 MG tablet Take 0.5 mg by mouth daily as needed for anxiety.     Marland Kitchen nystatin (MYCOSTATIN) 100000 UNIT/ML suspension Take 5 mLs by mouth 4 (four) times daily as needed (as needed for rash).    Marland Kitchen omeprazole (PRILOSEC) 40 MG capsule Take 40 mg by mouth daily.     Marland Kitchen triamcinolone (KENALOG) 0.1 % paste Use as directed 1 application in the mouth or throat 2 (two) times daily. On mouth corners for redness    . amoxicillin (AMOXIL) 250 MG/5ML suspension Take 5 mLs (250 mg total) by mouth 3 (three) times daily. (Patient not taking: Reported on 06/08/2019) 150 mL 0    No results found for this or any previous visit (from the past 48 hour(s)). No results found.  Review of Systems  Temperature 97.7 F (36.5 C), height 4\' 4"  (1.321 m), weight 31.8 kg. Physical Exam   Assessment/Plan Dental rehab under general anesthesia  , DMD 06/21/2019, 10:19  AM

## 2019-06-21 NOTE — Brief Op Note (Signed)
06/21/2019  1:42 PM  PATIENT:  Savannah Ruiz  29 y.o. female  PRE-OPERATIVE DIAGNOSIS:  DENTAL CARIES  POST-OPERATIVE DIAGNOSIS:  DENTAL CARIES  PROCEDURE:  Procedure(s): DENTAL RESTORATION/EXTRACTION OF TEETH 2, 10, 16, 17, 30, 31, 32, WITH Diagnostic X-RAY, Cleaning, Fillings TEETH 3, 15 (N/A)  SURGEON:  Surgeon(s) and Role:    Phineas Semen, DMD - Primary  PHYSICIAN ASSISTANT:   ASSISTANTS: Rudi Coco, DA II   ANESTHESIA:   general  EBL:  15 mL   BLOOD ADMINISTERED:none  DRAINS: none   LOCAL MEDICATIONS USED:  LIDOCAINE   SPECIMEN:  No Specimen  DISPOSITION OF SPECIMEN:  N/A  COUNTS:  YES  TOURNIQUET:  * No tourniquets in log *  DICTATION: .Note written in EPIC  PLAN OF CARE: Discharge to home after PACU  PATIENT DISPOSITION:  PACU - hemodynamically stable.   Delay start of Pharmacological VTE agent (>24hrs) due to surgical blood loss or risk of bleeding: No

## 2020-01-04 IMAGING — CT CT HEAD WITHOUT CONTRAST
3 of 10 series · 15 of 47 positions shown, 18 images · non-contrast
Comparison: None.

CLINICAL DATA: Facial injury after fall.

EXAM:
CT HEAD WITHOUT CONTRAST
CT MAXILLOFACIAL WITHOUT CONTRAST
TECHNIQUE: Multidetector CT imaging of the head and maxillofacial structures
were performed using the standard protocol without intravenous
contrast. Multiplanar CT image reconstructions of the maxillofacial
structures were also generated.

[Series 11: max soft · axial · 0.33mm/px · z∈[+1376,+1498]mm · 12 of 69 slices shown, 15 images]
[im 4/69  brain]
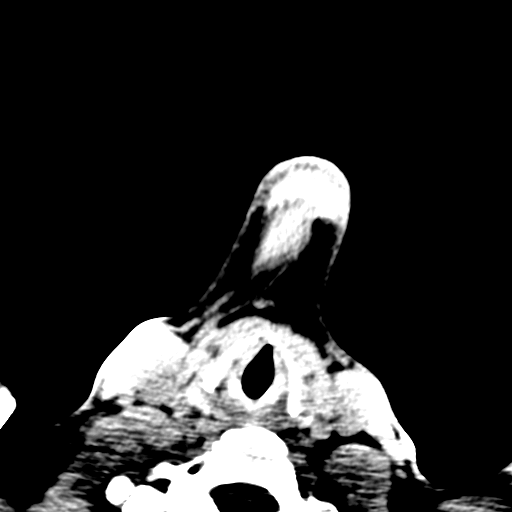
[im 4/69  bone]
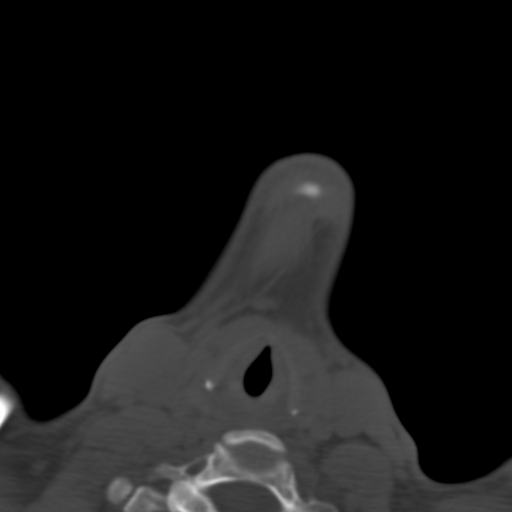
[im 12/69  brain]
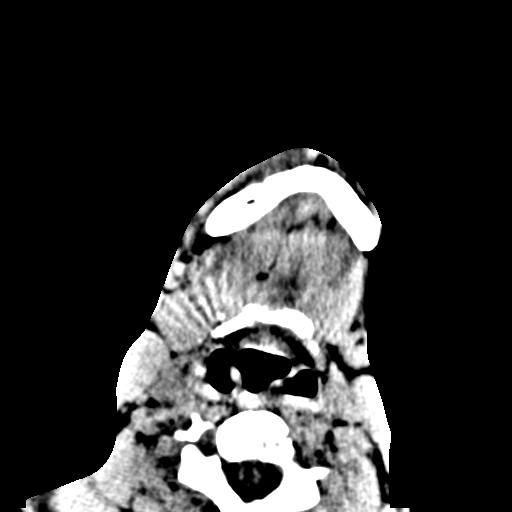
[im 16/69  brain]
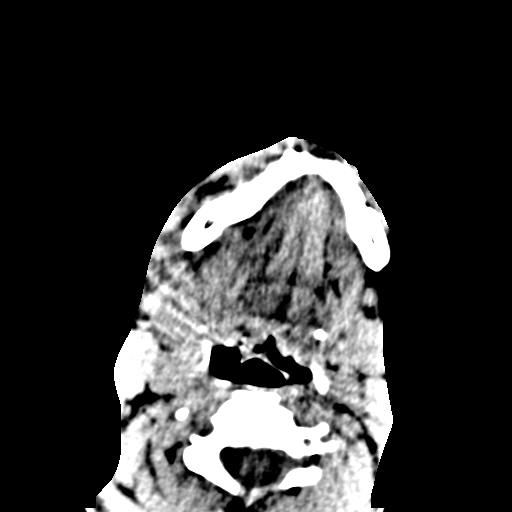
[im 19/69  brain]
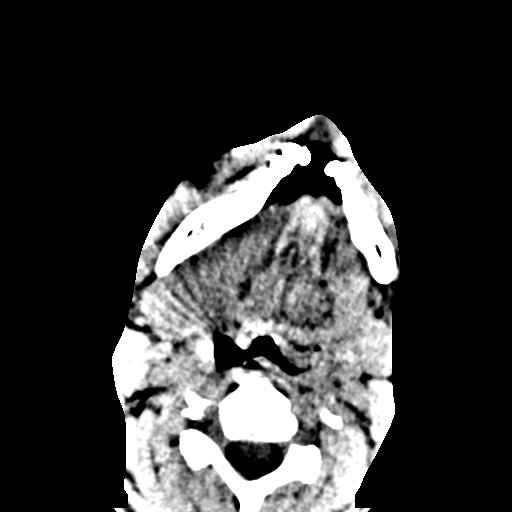
[im 27/69  brain]
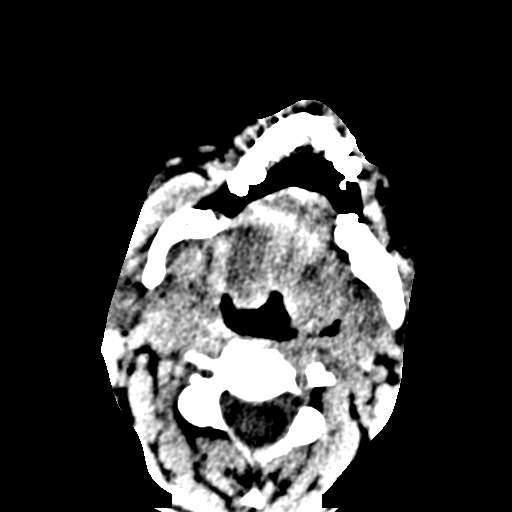
[im 27/69  bone]
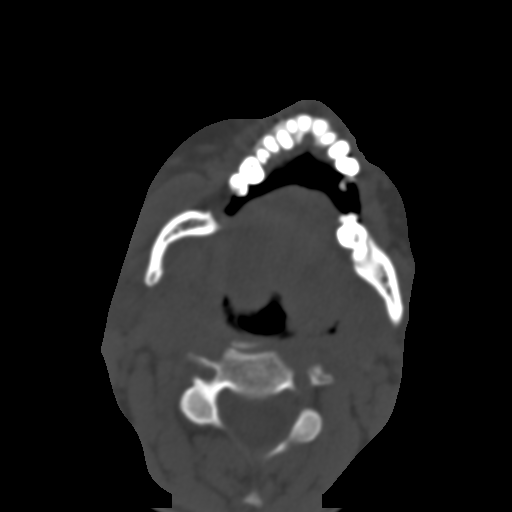
[im 31/69  brain]
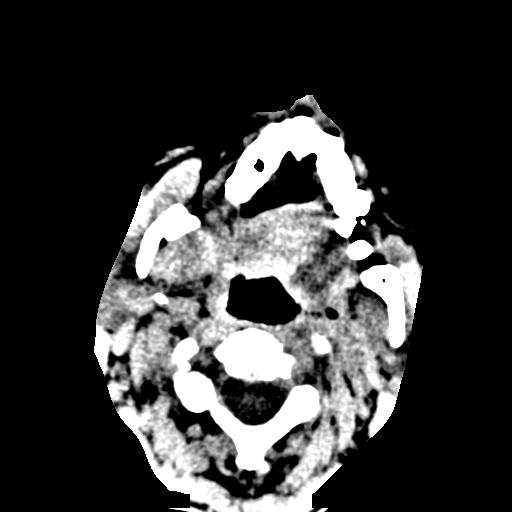
[im 38/69  brain]
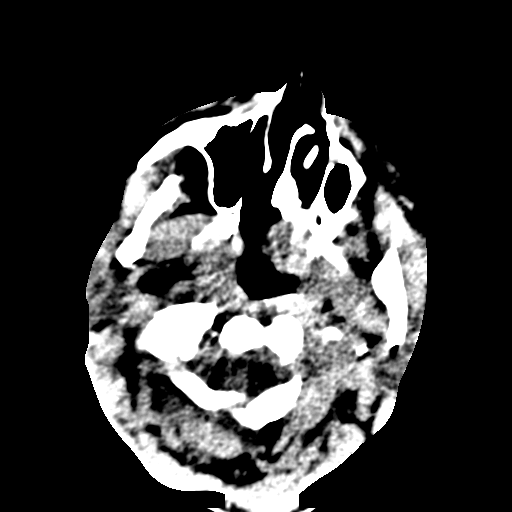
[im 42/69  brain]
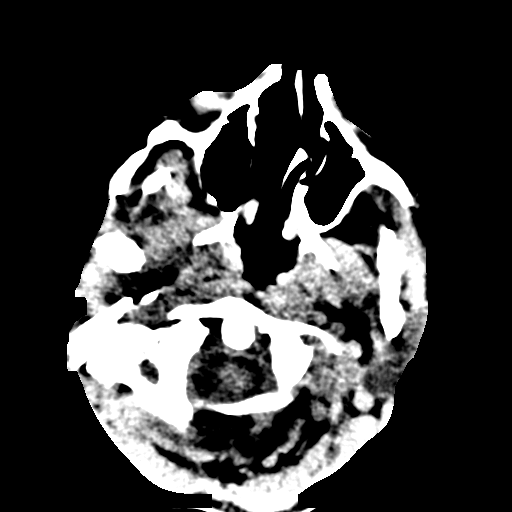
[im 50/69  brain]
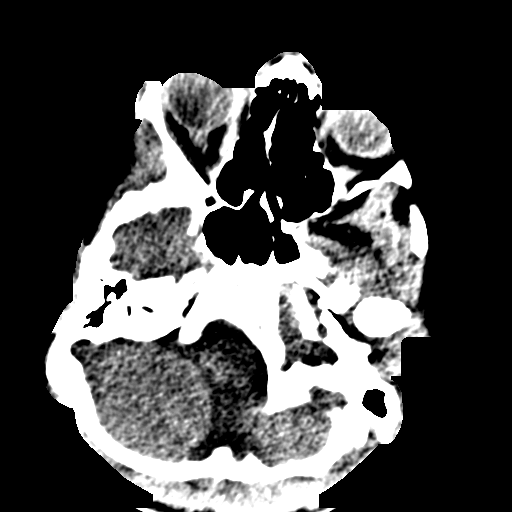
[im 50/69  bone]
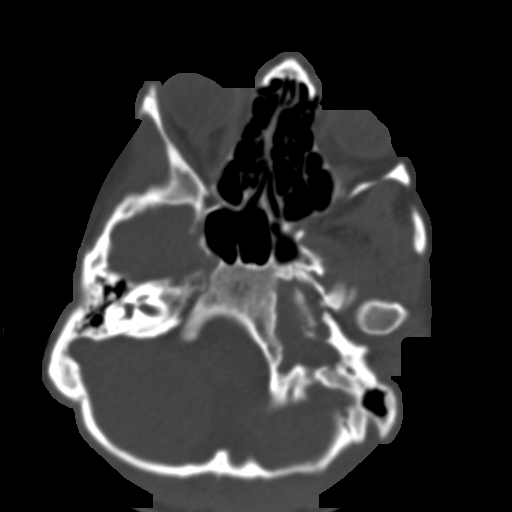
[im 53/69  brain]
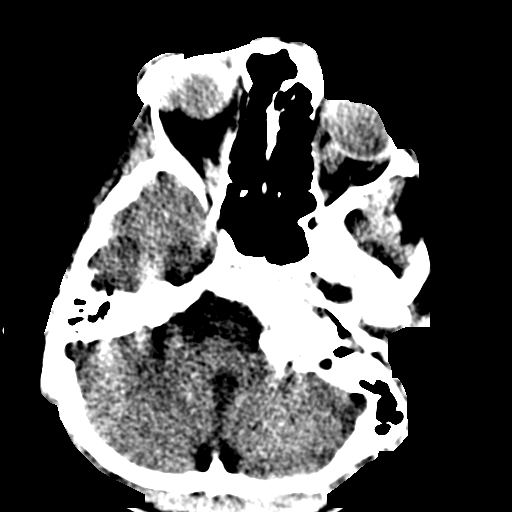
[im 57/69  brain]
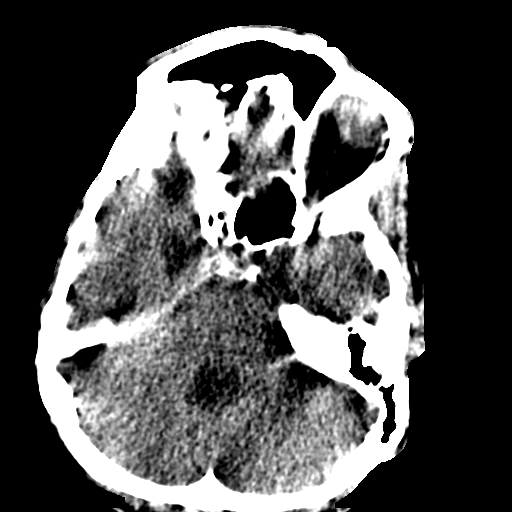
[im 65/69  brain]
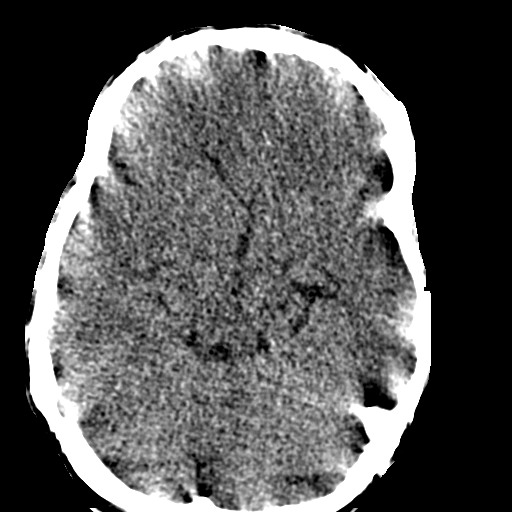

[Series 15: coronal soft · coronal · 0.27mm/px · 2 of 65 slices shown]
[im 7/65  brain]
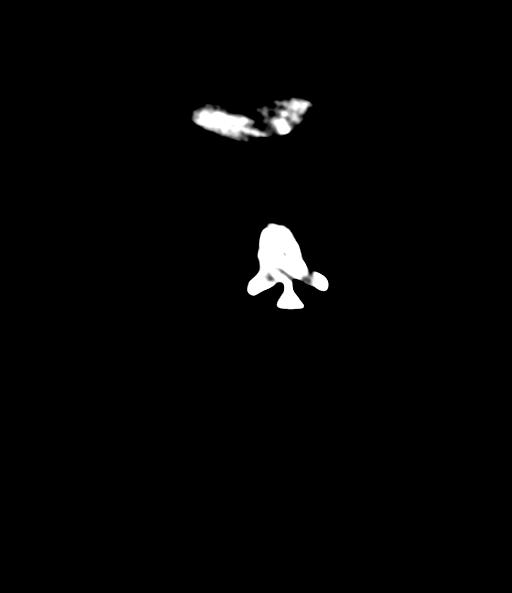
[im 36/65  brain]
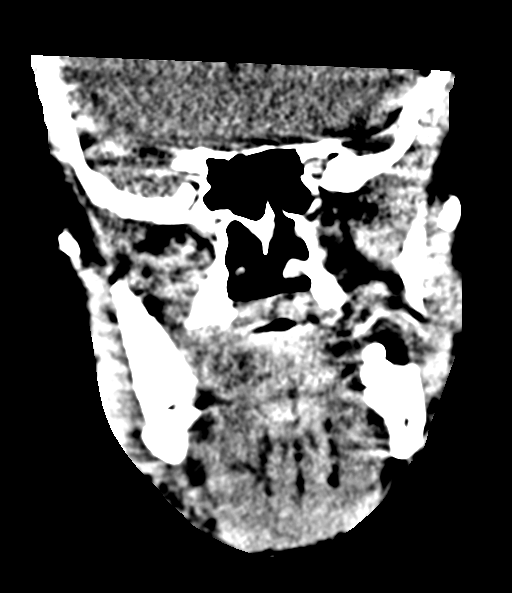

[Series 16: sagittal soft · sagittal · 0.30mm/px · 1 of 65 slices shown]
[im 33/65  brain]
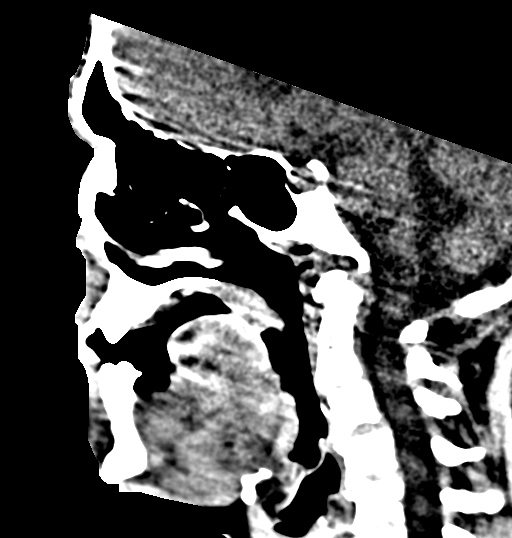

[15 of 47 positions shown; findings below may reference images not displayed]

FINDINGS: CT HEAD FINDINGS

Brain: No evidence of acute infarction, hemorrhage, hydrocephalus,
extra-axial collection or mass lesion/mass effect.

Vascular: No hyperdense vessel or unexpected calcification.

Skull: Normal. Negative for fracture or focal lesion.

Other: None.

CT MAXILLOFACIAL FINDINGS

Osseous: Nondisplaced fracture is seen involving the right
parasymphyseal region of the mandible. Mildly displaced fracture is
seen involving the right mandibular angle which extends to the most
posterior molar.

Orbits: Negative. No traumatic or inflammatory finding.

Sinuses: Clear.

Soft tissues: Negative.
IMPRESSION: Mildly displaced right mandibular angle fracture is noted, with
nondisplaced right parasymphyseal fracture of the mandible.

Normal head CT.

## 2020-11-09 ENCOUNTER — Emergency Department (HOSPITAL_BASED_OUTPATIENT_CLINIC_OR_DEPARTMENT_OTHER)
Admission: EM | Admit: 2020-11-09 | Discharge: 2020-11-09 | Disposition: A | Discharge status: Home / Self Care | Payer: Medicaid Other | Attending: Emergency Medicine | Admitting: Emergency Medicine

## 2020-11-09 ENCOUNTER — Other Ambulatory Visit: Payer: Self-pay

## 2020-11-09 DIAGNOSIS — W19XXXA Unspecified fall, initial encounter: Secondary | ICD-10-CM | POA: Insufficient documentation

## 2020-11-09 DIAGNOSIS — S01112A Laceration without foreign body of left eyelid and periocular area, initial encounter: Secondary | ICD-10-CM | POA: Diagnosis not present

## 2020-11-09 DIAGNOSIS — S0181XA Laceration without foreign body of other part of head, initial encounter: Secondary | ICD-10-CM

## 2020-11-09 DIAGNOSIS — S0993XA Unspecified injury of face, initial encounter: Secondary | ICD-10-CM | POA: Diagnosis present

## 2020-11-09 MED ORDER — LIDOCAINE-EPINEPHRINE-TETRACAINE (LET) TOPICAL GEL
3.0000 mL | Freq: Once | TOPICAL | Status: AC
  Administered 2020-11-09: 3 mL via TOPICAL
  Filled 2020-11-09: qty 3

## 2020-11-09 NOTE — ED Notes (Signed)
Left eye lac cleansed with ns as well as patient would allow.  Red wound bed, small amount of dried blood on lower end of lac. dermabond and wound care items at bedside.

## 2020-11-09 NOTE — Discharge Instructions (Addendum)
Keep area clean and dry.  The sutures will dissolve on their own.  If there is any redness, pus drainage, swelling, or other new concerning symptom, come back to ER for reassessment.  Follow-up with primary doctor as needed.

## 2020-11-09 NOTE — ED Triage Notes (Signed)
Patient reports to the ER for mechanical fall and laceration to her head above the left eye

## 2020-11-09 NOTE — ED Notes (Signed)
Dc home with belongings via wheelchair with dc instructions and caregiver. Caregiver stated understanding.

## 2020-11-10 NOTE — ED Provider Notes (Signed)
[facility] EMERGENCY DEPT Provider Note   CSN: [id number] Arrival date & time: 11/09/20  1451     History Chief Complaint  Patient presents with   Fall        Laceration    Savannah Ruiz is a 30 y.o. female.  Presented to ER with concern for laceration.  History is obtained from patient's caretaker at bedside.  She reports that patient had a witnessed mechanical fall and suffered laceration to her left forehead.  There was no LOC.  Patient has been acting appropriately at baseline.  Has history of cerebral palsy.  Nonverbal at baseline.  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   AGENESIS OF CORPUS CALLOSUM 05/07/2008   DYSPHAGIA UNSPECIFIED 05/07/2008    Past Surgical History:  Procedure Laterality Date   ANKLE SURGERY  2003-2004   DENTAL RESTORATION/EXTRACTION WITH X-RAY N/A 06/21/2019   Procedure: DENTAL RESTORATION/EXTRACTION OF TEETH 2, 10, 16, 17, 30, 31, 32, WITH Diagnostic X-RAY, Cleaning, Fillings TEETH 3, 15;  Surgeon: Cristino Martes, DMD;  Location: [facility] OR;  Service: Dentistry;  Laterality: N/A;   EYE SURGERY     MOUTH SURGERY     ORIF MANDIBULAR FRACTURE N/A 10/17/2018   Procedure: OPEN REDUCTION INTERNAL FIXATION (ORIF) OF MANDIBULAR FRACTURE;  Surgeon: Osborn Coho, MD;  Location[facility];  Service: ENT;  Laterality: N/A;     OB History   No obstetric history on file.     No family history on file.  Social History   Tobacco Use   Smoking status: Never   Smokeless tobacco: Never  Substance Use Topics   Alcohol use: Never   Drug use:  Never    Home Medications Prior to Admission medications   Medication Sig Start Date End Date Taking? Authorizing Provider  amoxicillin (AMOXIL) 250 MG/5ML suspension Take 5 mLs (250 mg total) by mouth 3 (three) times daily. Patient not taking: Reported on 06/08/2019 10/18/18   Osborn Coho, MD  ibuprofen (ADVIL) 200 MG tablet Take 200-400 mg by mouth every 6 (six) hours as needed for mild pain.     [provider]  LORazepam (ATIVAN) 0.5 MG tablet Take 0.5 mg by mouth daily as needed for anxiety.  09/22/18   [provider]  nystatin (MYCOSTATIN) 100000 UNIT/ML suspension Take 5 mLs by mouth 4 (four) times daily as needed (as needed for rash).    [provider]  omeprazole (PRILOSEC) 40 MG capsule Take 40 mg by mouth daily.  09/28/18   [provider]  triamcinolone (KENALOG) 0.1 % paste Use as directed 1 application in the mouth or throat 2 (two) times daily. On mouth corners for redness    [provider]    Allergies    Patient has no known allergies.  Review of Systems   Review of Systems  Unable to perform ROS: Patient nonverbal   Physical Exam Updated Vital Signs BP (!) 149/98 (BP Location: Left Arm)   Pulse (!) 107   Temp 97.9 F (36.6 C) (Tympanic)   Resp Marland Kitchen)  24   SpO2 98%   Physical Exam Vitals and nursing note reviewed.  Constitutional:      General: She is not in acute distress.    Appearance: She is well-developed.  HENT:     Head: Normocephalic.     Comments: 2 cm laceration to the left forehead extending into the left eyebrow, orbit is unaffected, eyes unaffected, smooth EOM Eyes:     Conjunctiva/sclera: Conjunctivae normal.  Neck:     Comments: No TTP to neck Cardiovascular:     Rate and Rhythm: Normal rate and regular rhythm.     Heart sounds: No murmur heard. Pulmonary:     Effort: Pulmonary effort is normal. No respiratory distress.     Breath sounds: Normal breath sounds.  Abdominal:     Palpations:  Abdomen is soft.     Tenderness: There is no abdominal tenderness.  Musculoskeletal:     Cervical back: Neck supple.     Comments: No TTP to all four extremities.   Skin:    General: Skin is warm and dry.  Neurological:     Mental Status: She is alert.    ED Results / Procedures / Treatments   Labs (all labs ordered are listed, but only abnormal results are displayed) Labs Reviewed - No data to display  EKG None  Radiology No results found.  Procedures .Marland KitchenLaceration Repair  Date/Time: 11/10/2020 4:11 PM Performed by: Milagros Loll, MD Authorized by: Milagros Loll, MD   Consent:    Consent obtained:  Verbal   Consent given by:  Guardian   Risks, benefits, and alternatives were discussed: yes     Risks discussed:  Infection, need for additional repair, nerve damage, poor wound healing, retained foreign body, tendon damage and vascular damage   Alternatives discussed:  No treatment, delayed treatment, observation and referral Universal protocol:    Immediately prior to procedure, a time out was called: yes   Anesthesia:    Anesthesia method:  Topical application   Topical anesthetic:  LET Laceration details:    Location:  Face   Face location:  L eyebrow   Length (cm):  2 Treatment:    Area cleansed with:  Saline   Amount of cleaning:  Extensive   Irrigation solution:  Sterile water and sterile saline   Irrigation method:  Syringe Skin repair:    Repair method:  Sutures   Suture size:  5-0   Suture material:  Fast-absorbing gut   Suture technique:  Simple interrupted   Number of sutures:  2 Approximation:    Approximation:  Close Repair type:    Repair type:  Simple Post-procedure details:    Procedure completion:  Tolerated well, no immediate complications   Medications Ordered in ED Medications  lidocaine-EPINEPHrine-tetracaine (LET) topical gel (3 mLs Topical Given 11/09/20 1730)    ED Course  I have reviewed the triage vital signs and the  nursing notes.  Pertinent labs & imaging results that were available during my care of the patient were reviewed by me and considered in my medical decision making (see chart for details).    MDM Rules/Calculators/A&P                           30 year old female with cerebral palsy, nonverbal at baseline comes to ER with caretaker for small laceration to her left forehead/left eyebrow.  No other trauma identified on exam.  Patient at baseline.  No report of  vomiting or lethargy.  Performed laceration repair.  Good approximation of wound.  Discharged home with caretaker.    After the discussed management above, the patient was determined to be safe for discharge.  The patient was in agreement with this plan and all questions regarding their care were answered.  ED return precautions were discussed and the patient will return to the ED with any significant worsening of condition.  Final Clinical Impression(s) / ED Diagnoses Final diagnoses:  Facial laceration, initial encounter    Rx / DC Orders ED Discharge Orders     None        Milagros Loll, MD 11/10/20 [phone]

## 2022-04-01 NOTE — Therapy (Signed)
OUTPATIENT PHYSICAL THERAPY NEURO EVALUATION   Patient Name: Savannah Ruiz MRN: [id number] DOB:March 15, 1991, 31 y.o., female Today's Date: 04/06/2022   PCP: Madelin Headings, MD  REFERRING PROVIDER: Morrell Riddle, PA-C  END OF SESSION:  PT End of Session - 04/06/22 1650     Visit Number 1    Number of Visits 13    Date for PT Re-Evaluation 05/18/22    Authorization Type Medicare/Traditional Medicaid    PT Start Time 1617    PT Stop Time 1650    PT Time Calculation (min) 33 min    Equipment Utilized During Treatment Gait belt    Activity Tolerance Patient tolerated treatment well;Patient limited by fatigue    Behavior During Therapy Restless             Past Medical History:  Diagnosis Date   Agenesis of corpus callosum (HCC)    CP (cerebral palsy) (HCC)    GERD (gastroesophageal reflux disease)    Past Surgical History:  Procedure Laterality Date   ANKLE SURGERY  2003-2004   DENTAL RESTORATION/EXTRACTION WITH X-RAY N/A 06/21/2019   Procedure: DENTAL RESTORATION/EXTRACTION OF TEETH 2, 10, 16, 17, 30, 31, 32, WITH Diagnostic X-RAY, Cleaning, Fillings TEETH 3, 15;  Surgeon: Cristino Martes, DMD;  Location: [facility] OR;  Service: Dentistry;  Laterality: N/A;   EYE SURGERY     MOUTH SURGERY     ORIF MANDIBULAR FRACTURE N/A 10/17/2018   Procedure: OPEN REDUCTION INTERNAL FIXATION (ORIF) OF MANDIBULAR FRACTURE;  Surgeon: Osborn Coho, MD;  Location: [facility] OR;  Service: ENT;  Laterality: N/A;   Patient Active Problem List   Diagnosis Date Noted   Mandible fracture (HCC) 10/17/2018   Mandible open fracture (HCC) 10/17/2018   ACUT SUPPRATV OTITIS MEDIA W/SPONT RUP EARDRUM 02/04/2009   VIRAL URI 09/20/2008   SINUSITIS - ACUTE-NOS 08/09/2008   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   AGENESIS OF CORPUS CALLOSUM 05/07/2008   DYSPHAGIA UNSPECIFIED 05/07/2008    ONSET DATE: 2 months  REFERRING DIAG: 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 M41.9 (ICD-10-CM) - Scoliosis,  unspecified R26.9 (ICD-10-CM) - Unspecified abnormalities of gait and mobility Q04.0 (ICD-10-CM) - Congenital malformations of corpus callosum  THERAPY DIAG:  Other abnormalities of gait and mobility  Other symptoms and signs involving the nervous system  Unsteadiness on feet  Abnormal posture  Rationale for Evaluation and Treatment: Rehabilitation  SUBJECTIVE:                                                                                                                                                                                             SUBJECTIVE STATEMENT:  Patient's mother provides history as patient is nonverbal. Patient's mother reports that the patient was walking well with her posterior walker without a gait belt for up to 20 years. Had a fall in July of 2020 when she fell and broke her jaw while walking with her caregiver. Reports that pt has scoliosis- has not noticed a visible change in the curvature but does report that R leg is different. Notes that her walking has been more difficult for her since 2 months ago and recalls that the R foot was bruised at some point recently- unsure if this contributed. Patient has not had PT in a while. Used to use B AFOs but has not been wearing them lately Mother reports that she loves to walk and usually goes for short walks up to 7x/day. Also enjoys music, pool, food.   Pt accompanied by: family member mother  PERTINENT HISTORY: Agenesis of corpus callosum, CP, GERD, mandibular ORIF 2020, per mother- B ankle fusion   PAIN:  Are you having pain?  Patient nonverbal- mother wonders if she is in pain  PRECAUTIONS: Fall; patient nonverbal   WEIGHT BEARING RESTRICTIONS: No  FALLS: Has patient fallen in last 6 months? No  LIVING ENVIRONMENT: Lives with: lives with their family and lives in supportive living home with 24hr supervision Lives in: Group home Stairs: no stairs in group home; stairs to enter parent's home Has following  equipment at home: 2 posterior walkers, wheelchair  PLOF: Independent with household mobility with device, Independent with community mobility with device, and Needs assistance with ADLs  PATIENT GOALS: get back to walking   OBJECTIVE:   DIAGNOSTIC FINDINGS: none recent  COGNITION: Overall cognitive status: Impaired at baseline   OBSERVATION: R hand red and hot- mother reports that hands get chapped d/t drooling  PALPATION: no TTP along spine; seemingly TTP over R medial knee over pes anserine   EDEMA: no edema evident over knees or lower legs  MUSCLE TONE: high tone in B Les with much more significant tightness in R>L HS  POSTURE: significant R thoracic convex curvature; B knees flexed in stance with significant R knee valgum   LOWER EXTREMITY ROM:     Active  Right Eval Left Eval  Hip flexion    Hip extension    Hip abduction    Hip adduction    Hip internal rotation    Hip external rotation    Knee flexion    Knee extension    Ankle dorsiflexion    Ankle plantarflexion    Ankle inversion    Ankle eversion     (Blank rows = not tested)  LOWER EXTREMITY MMT:    MMT Right Eval Left Eval  Hip flexion    Hip extension    Hip abduction    Hip adduction    Hip internal rotation    Hip external rotation    Knee flexion    Knee extension    Ankle dorsiflexion    Ankle plantarflexion    Ankle inversion    Ankle eversion    (Blank rows = not tested)   TRANSFERS: Assistive device utilized: None  Sit to stand:  CGA for safety Stand to sit:  CGA for safety    GAIT: Gait pattern: R knee valgus, toe walking R>L with crouched posture, ataxic pattern  Assistive device utilized: posterior RW or 2 HHA with mod A d/t imbalance    FUNCTIONAL TESTS:  NT    TODAY'S TREATMENT:  DATE: 04/06/22    PATIENT EDUCATION: Education details:  prognosis, POC, discussed pt's upcoming spine appointment  Person educated: Parent Education method: Explanation, Demonstration, Tactile cues, and Verbal cues Education comprehension: verbalized understanding  HOME EXERCISE PROGRAM: Not yet initiated   GOALS: Goals reviewed with patient? Yes  SHORT TERM GOALS: Target date: 04/27/2022  Patient to perform initial HEP with parent or caregiver's assist.  Baseline: HEP initiated Goal status: INITIAL    LONG TERM GOALS: Target date: 05/18/2022  Patient to perform advanced HEP with parent or caregiver's assist.  Baseline: Not yet initiated  Goal status: INITIAL  Patient to demonstrate 10 degree improvement in R HS length.  Baseline: NT Goal status: INITIAL  Patient to demonstrate 50% improvement R knee alignment with standing or ambulation.  Baseline: - Goal status: INITIAL  Patient to ambulate 200 feet with LRAD with good safety and stability.  Baseline: requires mod A with walker Goal status: INITIAL  Balance goal to be written as appropriate.  Baseline: NT Goal status: INITIAL   ASSESSMENT:  CLINICAL IMPRESSION:  Patient is a 31 y/o F, PMH significant for spastic hemiplegic CP, presenting to OPPT with mother who reports increased difficulty with gait for the past 2 months without known cause. At [facility], mother reports that the patient was ambulating without issue with a posterior walker and without AFOs. Patient today presenting with TTP over R medial knee, increased B LE tone with marked tightness in R HS, R convex thoracic curvature and abnormal R>L knee alignment, imbalance with transfers and gait, and gait deviations. Would benefit from skilled PT services 1-2 x/week for 6 weeks to address aforementioned impairments in order to optimize level of function.    OBJECTIVE IMPAIRMENTS: decreased activity tolerance, decreased balance, decreased coordination, decreased mobility, difficulty walking, decreased ROM, decreased  strength, decreased safety awareness, increased fascial restrictions, increased muscle spasms, impaired flexibility, impaired tone, improper body mechanics, postural dysfunction, and pain.   ACTIVITY LIMITATIONS: bending, sitting, standing, squatting, stairs, transfers, bed mobility, bathing, toileting, dressing, and locomotion level  PARTICIPATION LIMITATIONS: shopping, community activity, and church  PERSONAL FACTORS: Age, Behavior pattern, Past/current experiences, Time since onset of injury/illness/exacerbation, and 3+ comorbidities: Agenesis of corpus callosum, CP, GERD, mandibular ORIF 2020, B ankle fusion   are also affecting patient's functional outcome.   REHAB POTENTIAL: Good  CLINICAL DECISION MAKING: Unstable/unpredictable  EVALUATION COMPLEXITY: Moderate  PLAN:  PT FREQUENCY: 1-2x/week  PT DURATION: 6 weeks  PLANNED INTERVENTIONS: Therapeutic exercises, Therapeutic activity, Neuromuscular re-education, Balance training, Gait training, Patient/Family education, Self Care, Joint mobilization, Stair training, Vestibular training, Canalith repositioning, Orthotic/Fit training, DME instructions, Aquatic Therapy, Dry Needling, Cryotherapy, Moist heat, Taping, Manual therapy, and Re-evaluation  PLAN FOR NEXT SESSION: balance assessment, measure HS tightness; initiate stretching HEP with caregiver    Anette Guarneri, PT, DPT 04/06/22 5:18 PM  [facility] [address] [place], [place] [address] Phone # [phone] Fax # [phone]

## 2022-04-06 ENCOUNTER — Encounter: Payer: Self-pay | Admitting: Physical Therapy

## 2022-04-06 ENCOUNTER — Other Ambulatory Visit: Payer: Self-pay

## 2022-04-06 ENCOUNTER — Ambulatory Visit: Payer: Medicare Other | Attending: Physician Assistant | Admitting: Physical Therapy

## 2022-04-06 DIAGNOSIS — R29818 Other symptoms and signs involving the nervous system: Secondary | ICD-10-CM | POA: Insufficient documentation

## 2022-04-06 DIAGNOSIS — R293 Abnormal posture: Secondary | ICD-10-CM | POA: Diagnosis present

## 2022-04-06 DIAGNOSIS — R2689 Other abnormalities of gait and mobility: Secondary | ICD-10-CM | POA: Insufficient documentation

## 2022-04-06 DIAGNOSIS — R2681 Unsteadiness on feet: Secondary | ICD-10-CM | POA: Insufficient documentation

## 2022-08-18 ENCOUNTER — Encounter (HOSPITAL_COMMUNITY): Payer: Self-pay | Admitting: *Deleted

## 2022-08-18 ENCOUNTER — Ambulatory Visit: Payer: Self-pay | Admitting: Dentistry

## 2022-08-18 ENCOUNTER — Other Ambulatory Visit: Payer: Self-pay

## 2022-08-18 DIAGNOSIS — F840 Autistic disorder: Secondary | ICD-10-CM

## 2022-08-18 NOTE — Progress Notes (Addendum)
This Clinical research associate called and spoke with patient's mother due to the patient condition (non-verbal, hx. Of CP). Mother received instructions for the surgery day.  PCP - Benny Lennert, PA with [facility]  Cardiologist - denies  PPM/ICD - n/a  CPAP - n/a  Fasting Blood Sugar - n/a  Blood Thinner Instructions: n/a Patient was instructed: As of today, STOP taking any Aspirin (unless otherwise instructed by your surgeon) Aleve, Naproxen, Ibuprofen, Motrin, Advil, Goody's, BC's, all herbal medications, fish oil, and all vitamins.  ERAS Protcol - yes, until 08:00 o'clock  COVID TEST- n/a  Anesthesia review: yes - history of CP  Patient verbally denies any shortness of breath, fever, cough and chest pain during phone call   -------------  SDW INSTRUCTIONS given:  Your procedure is scheduled on Thursday, May 9th, 2024.  Report to [facility] Main Entrance "A" at 08:30 A.M., and check in at the Admitting office.  Call this number if you have problems the morning of surgery:  [phone]   Remember:  Do not eat after midnight the night before your surgery  You may drink clear liquids until 08:00 the morning of your surgery.   Clear liquids allowed are: Water, Non-Citrus Juices (without pulp), Carbonated Beverages, Clear Tea, Black Coffee Only, and Gatorade    Take these medicines the morning of surgery with A SIP OF WATER: Prilosec, Ativan PRN: Tylenol   The day of surgery:                     Do not wear jewelry, make up, or nail polish            Do not wear lotions, powders, perfumes, or deodorant.            Do not shave 48 hours prior to surgery.              Do not bring valuables to the hospital.            [facility] is not responsible for any belongings or valuables.  Do NOT Smoke (Tobacco/Vaping) 24 hours prior to your procedure If you use a CPAP at night, you may bring all equipment for your overnight stay.   Contacts, glasses, dentures or bridgework may not be worn into  surgery.      For patients admitted to the hospital, discharge time will be determined by your treatment team.   Patients discharged the day of surgery will not be allowed to drive home, and someone needs to stay with them for 24 hours.    Special instructions:   [place]- Preparing For Surgery  Before surgery, you can play an important role. Because skin is not sterile, your skin needs to be as free of germs as possible. You can reduce the number of germs on your skin by washing with CHG (chlorahexidine gluconate) Soap before surgery.  CHG is an antiseptic cleaner which kills germs and bonds with the skin to continue killing germs even after washing.    Oral Hygiene is also important to reduce your risk of infection.  Remember - BRUSH YOUR TEETH THE MORNING OF SURGERY WITH YOUR REGULAR TOOTHPASTE  Please do not use if you have an allergy to CHG or antibacterial soaps. If your skin becomes reddened/irritated stop using the CHG.  Do not shave (including legs and underarms) for at least 48 hours prior to first CHG shower. It is OK to shave your face.  Please follow these instructions carefully.  Shower the NIGHT BEFORE SURGERY and the MORNING OF SURGERY with DIAL Soap.   Pat yourself dry with a CLEAN TOWEL.  Wear CLEAN PAJAMAS to bed the night before surgery  Place CLEAN SHEETS on your bed the night of your first shower and DO NOT SLEEP WITH PETS.   Day of Surgery: Please shower morning of surgery  Wear Clean/Comfortable clothing the morning of surgery Do not apply any deodorants/lotions.   Remember to brush your teeth WITH YOUR REGULAR TOOTHPASTE.   Questions were answered. Patient verbalized understanding of instructions.

## 2022-08-19 ENCOUNTER — Ambulatory Visit (HOSPITAL_BASED_OUTPATIENT_CLINIC_OR_DEPARTMENT_OTHER): Payer: Medicare Other

## 2022-08-19 ENCOUNTER — Encounter (HOSPITAL_COMMUNITY): Payer: Self-pay | Admitting: *Deleted

## 2022-08-19 ENCOUNTER — Ambulatory Visit (HOSPITAL_COMMUNITY): Payer: Medicare Other

## 2022-08-19 ENCOUNTER — Other Ambulatory Visit: Payer: Self-pay

## 2022-08-19 ENCOUNTER — Ambulatory Visit (HOSPITAL_COMMUNITY)
Admission: RE | Admit: 2022-08-19 | Discharge: 2022-08-19 | Disposition: A | Discharge status: Home / Self Care | Payer: Medicare Other | Attending: Dentistry | Admitting: Dentistry

## 2022-08-19 ENCOUNTER — Encounter (HOSPITAL_COMMUNITY)
Admission: RE | Disposition: A | Discharge status: Home / Self Care | Payer: Self-pay | Source: Home / Self Care | Attending: Dentistry

## 2022-08-19 DIAGNOSIS — X58XXXA Exposure to other specified factors, initial encounter: Secondary | ICD-10-CM | POA: Diagnosis not present

## 2022-08-19 DIAGNOSIS — K029 Dental caries, unspecified: Secondary | ICD-10-CM | POA: Insufficient documentation

## 2022-08-19 DIAGNOSIS — F840 Autistic disorder: Secondary | ICD-10-CM

## 2022-08-19 DIAGNOSIS — G809 Cerebral palsy, unspecified: Secondary | ICD-10-CM | POA: Diagnosis not present

## 2022-08-19 DIAGNOSIS — K08409 Partial loss of teeth, unspecified cause, unspecified class: Secondary | ICD-10-CM | POA: Insufficient documentation

## 2022-08-19 DIAGNOSIS — K130 Diseases of lips: Secondary | ICD-10-CM | POA: Insufficient documentation

## 2022-08-19 DIAGNOSIS — Q040 Congenital malformations of corpus callosum: Secondary | ICD-10-CM | POA: Diagnosis not present

## 2022-08-19 DIAGNOSIS — F79 Unspecified intellectual disabilities: Secondary | ICD-10-CM | POA: Diagnosis not present

## 2022-08-19 DIAGNOSIS — K047 Periapical abscess without sinus: Secondary | ICD-10-CM | POA: Diagnosis not present

## 2022-08-19 DIAGNOSIS — S025XXA Fracture of tooth (traumatic), initial encounter for closed fracture: Secondary | ICD-10-CM | POA: Diagnosis not present

## 2022-08-19 HISTORY — PX: DENTAL RESTORATION/EXTRACTION WITH X-RAY: SHX5796

## 2022-08-19 SURGERY — DENTAL RESTORATION/EXTRACTION WITH X-RAY
Anesthesia: General | Site: Mouth

## 2022-08-19 MED ORDER — 0.9 % SODIUM CHLORIDE (POUR BTL) OPTIME
TOPICAL | Status: DC | PRN
  Administered 2022-08-19: 1000 mL

## 2022-08-19 MED ORDER — SUGAMMADEX SODIUM 200 MG/2ML IV SOLN
INTRAVENOUS | Status: DC | PRN
  Administered 2022-08-19: 100 mg via INTRAVENOUS

## 2022-08-19 MED ORDER — LIDOCAINE-EPINEPHRINE 2 %-1:100000 IJ SOLN
INTRAMUSCULAR | Status: DC | PRN
  Administered 2022-08-19: 3.4 mL

## 2022-08-19 MED ORDER — PHENYLEPHRINE 80 MCG/ML (10ML) SYRINGE FOR IV PUSH (FOR BLOOD PRESSURE SUPPORT)
PREFILLED_SYRINGE | INTRAVENOUS | Status: AC
  Filled 2022-08-19: qty 10

## 2022-08-19 MED ORDER — CHLORHEXIDINE GLUCONATE 0.12 % MT SOLN
15.0000 mL | Freq: Once | OROMUCOSAL | Status: AC
  Administered 2022-08-19: 15 mL via OROMUCOSAL
  Filled 2022-08-19: qty 15

## 2022-08-19 MED ORDER — ORAL CARE MOUTH RINSE: 15.0000 mL | Freq: Once | OROMUCOSAL | Status: AC

## 2022-08-19 MED ORDER — CHLORHEXIDINE GLUCONATE 0.12 % MT SOLN
OROMUCOSAL | Status: AC
  Filled 2022-08-19: qty 15

## 2022-08-19 MED ORDER — ROCURONIUM BROMIDE 10 MG/ML (PF) SYRINGE
PREFILLED_SYRINGE | INTRAVENOUS | Status: AC
  Filled 2022-08-19: qty 30

## 2022-08-19 MED ORDER — OXYMETAZOLINE HCL 0.05 % NA SOLN
NASAL | Status: DC | PRN
  Administered 2022-08-19: 2 via NASAL

## 2022-08-19 MED ORDER — DEXAMETHASONE SODIUM PHOSPHATE 10 MG/ML IJ SOLN
INTRAMUSCULAR | Status: AC
  Filled 2022-08-19: qty 1

## 2022-08-19 MED ORDER — ROCURONIUM BROMIDE 10 MG/ML (PF) SYRINGE
PREFILLED_SYRINGE | INTRAVENOUS | Status: DC | PRN
  Administered 2022-08-19 (×2): 10 mg via INTRAVENOUS
  Administered 2022-08-19: 20 mg via INTRAVENOUS

## 2022-08-19 MED ORDER — FENTANYL CITRATE (PF) 250 MCG/5ML IJ SOLN
INTRAMUSCULAR | Status: AC
  Filled 2022-08-19: qty 5

## 2022-08-19 MED ORDER — LIDOCAINE-EPINEPHRINE 2 %-1:100000 IJ SOLN
INTRAMUSCULAR | Status: AC
  Filled 2022-08-19: qty 1.7

## 2022-08-19 MED ORDER — ONDANSETRON HCL 4 MG/2ML IJ SOLN
INTRAMUSCULAR | Status: AC
  Filled 2022-08-19: qty 2

## 2022-08-19 MED ORDER — FENTANYL CITRATE (PF) 100 MCG/2ML IJ SOLN: 25.0000 ug | INTRAMUSCULAR | Status: DC | PRN

## 2022-08-19 MED ORDER — FENTANYL CITRATE (PF) 250 MCG/5ML IJ SOLN
INTRAMUSCULAR | Status: DC | PRN
  Administered 2022-08-19: 50 ug via INTRAVENOUS

## 2022-08-19 MED ORDER — ONDANSETRON HCL 4 MG/2ML IJ SOLN
INTRAMUSCULAR | Status: DC | PRN
  Administered 2022-08-19: 3 mg via INTRAVENOUS

## 2022-08-19 MED ORDER — HEMOSTATIC AGENTS (NO CHARGE) OPTIME
TOPICAL | Status: DC | PRN
  Administered 2022-08-19: 1

## 2022-08-19 MED ORDER — ACETAMINOPHEN 10 MG/ML IV SOLN
INTRAVENOUS | Status: AC
  Filled 2022-08-19: qty 100

## 2022-08-19 MED ORDER — ACETAMINOPHEN 10 MG/ML IV SOLN
INTRAVENOUS | Status: DC | PRN
  Administered 2022-08-19: 500 mg via INTRAVENOUS

## 2022-08-19 MED ORDER — DEXAMETHASONE SODIUM PHOSPHATE 10 MG/ML IJ SOLN
INTRAMUSCULAR | Status: DC | PRN
  Administered 2022-08-19: 3 mg via INTRAVENOUS

## 2022-08-19 MED ORDER — KETOROLAC TROMETHAMINE 30 MG/ML IJ SOLN
INTRAMUSCULAR | Status: AC
  Filled 2022-08-19: qty 1

## 2022-08-19 MED ORDER — DEXMEDETOMIDINE HCL IN NACL 80 MCG/20ML IV SOLN
INTRAVENOUS | Status: DC | PRN
  Administered 2022-08-19: 6 ug via INTRAVENOUS

## 2022-08-19 MED ORDER — MIDAZOLAM HCL 2 MG/ML PO SYRP
15.0000 mg | ORAL_SOLUTION | Freq: Once | ORAL | Status: AC
  Administered 2022-08-19: 15 mg via ORAL

## 2022-08-19 MED ORDER — KETOROLAC TROMETHAMINE 30 MG/ML IJ SOLN
INTRAMUSCULAR | Status: DC | PRN
  Administered 2022-08-19: 15 mg via INTRAVENOUS

## 2022-08-19 MED ORDER — LACTATED RINGERS IV SOLN: INTRAVENOUS | Status: DC

## 2022-08-19 MED ORDER — PROPOFOL 10 MG/ML IV BOLUS
INTRAVENOUS | Status: AC
  Filled 2022-08-19: qty 20

## 2022-08-19 MED ORDER — PROPOFOL 10 MG/ML IV BOLUS
INTRAVENOUS | Status: DC | PRN
  Administered 2022-08-19: 50 mg via INTRAVENOUS

## 2022-08-19 MED ORDER — MIDAZOLAM HCL 2 MG/ML PO SYRP
ORAL_SOLUTION | ORAL | Status: AC
  Filled 2022-08-19: qty 10

## 2022-08-19 SURGICAL SUPPLY — 26 items
BLADE SURG 15 STRL LF DISP TIS (BLADE) ×1 IMPLANT
BLADE SURG 15 STRL SS (BLADE) ×1
CANISTER SUCT 3000ML PPV (MISCELLANEOUS) ×1 IMPLANT
COVER BACK TABLE 60X90IN (DRAPES) ×1 IMPLANT
COVER MAYO STAND STRL (DRAPES) ×1 IMPLANT
COVER SURGICAL LIGHT HANDLE (MISCELLANEOUS) ×1 IMPLANT
DRAPE HALF SHEET 40X57 (DRAPES) ×1 IMPLANT
GAUZE PACKING FOLDED 2  STR (GAUZE/BANDAGES/DRESSINGS) ×1
GAUZE PACKING FOLDED 2 STR (GAUZE/BANDAGES/DRESSINGS) ×1 IMPLANT
GAUZE SPONGE 4X4 16PLY XRAY LF (GAUZE/BANDAGES/DRESSINGS) ×1 IMPLANT
GLOVE BIO SURGEON STRL SZ 6.5 (GLOVE) ×1 IMPLANT
GOWN STRL REUS W/ TWL LRG LVL3 (GOWN DISPOSABLE) ×2 IMPLANT
GOWN STRL REUS W/TWL LRG LVL3 (GOWN DISPOSABLE) ×2
KIT BASIN OR (CUSTOM PROCEDURE TRAY) ×1 IMPLANT
KIT TURNOVER KIT B (KITS) ×1 IMPLANT
NDL DENTAL 27 LONG (NEEDLE) ×1 IMPLANT
NEEDLE DENTAL 27 LONG (NEEDLE) ×1 IMPLANT
PAD ARMBOARD 7.5X6 YLW CONV (MISCELLANEOUS) ×2 IMPLANT
SPONGE SURGIFOAM ABS GEL SZ50 (HEMOSTASIS) IMPLANT
SUT CHROMIC 3 0 PS 2 (SUTURE) ×1 IMPLANT
SYR BULB IRRIG 60ML STRL (SYRINGE) ×1 IMPLANT
TOOTHBRUSH ADULT (PERSONAL CARE ITEMS) ×1 IMPLANT
TOWEL GREEN STERILE (TOWEL DISPOSABLE) ×1 IMPLANT
TUBE CONNECTING 12X1/4 (SUCTIONS) ×1 IMPLANT
WATER TABLETS ICX (MISCELLANEOUS) ×1 IMPLANT
YANKAUER SUCT BULB TIP NO VENT (SUCTIONS) ×1 IMPLANT

## 2022-08-19 NOTE — Transfer of Care (Signed)
Immediate Anesthesia Transfer of Care Note  Patient: Savannah Ruiz  Procedure(s) Performed: DENTAL RESTORATION/EXTRACTIONS (Mouth)  Patient Location: PACU  Anesthesia Type:General  Level of Consciousness: sedated  Airway & Oxygen Therapy: Patient Spontanous Breathing and Patient connected to nasal cannula oxygen  Post-op Assessment: Report given to RN and Post -op Vital signs reviewed and stable  Post vital signs: Reviewed and stable  Last Vitals:  Vitals Value Taken Time  BP 122/82 08/19/22 1318  Temp    Pulse 86 08/19/22 1319  Resp    SpO2 99 % 08/19/22 1319  Vitals shown include unvalidated device data.  Last Pain:  Vitals:   08/19/22 0854  TempSrc: Oral         Complications: No notable events documented.

## 2022-08-19 NOTE — Op Note (Addendum)
[facility]  08/19/2022 Savannah Ruiz [id number]  Preop DX: Dental caries/behavior management issues due to intellectual disabilities/developmental disabilities. Dental Care provided in OR for medically necessary treatment.  Surgeon: Joanna Hews, DMD  Assistant: April Riggsbee and hospital staff.  Anesthesia: General  Procedure: The patient was brought into the operating room and placed on the table in a supine position.  General anesthesia was administered via nasal intubation.  The patient was prepped and draped in the usual manner for an intra-oral general dentistry procedure. The oropharynx was suctioned and a moistened oropharyngeal throat pack was placed.    A full intra-oral exam including all hard and soft tissues was performed.  Type of Exam: Recall   Soft Tissue Exam: Floor of the mouth: Normal Buccal mucosa: Normal Soft palate: Normal Hard palate: Normal Tongue: Normal Gingival: Normal Frenum: Normal  Hard tissue exam:  Present: # 3-9,11,13-15, 18-23, 26-28 Missing: # 1-2, 10, 12, 16, 17, 24-25, 29-32 Radiographic findings decay: # 22M, 4, 62M, , , 19D, 47M, 22D,  Radiographic findings abscess: # 8,9 Mobility: 8 (class II), 18 (class III), 22 (class II), 23 (class III) #4 lingual cusp fractured - below bone level #8 crack line (vertical) #21 horizontal root fracture  Full mouth series of digital radiographs taken and reviewed. A comprehensive treatment plan was developed.  Operative care was accomplished in a standard fashion using high/low speed drills with copious irrigation.  Routine extractions were accomplished with simple elevation and use of forceps.  Gel foam was placed in the sockets and hemostasis established with firm pressure. Surgical sites were closed with 3-0 Chromic sutures.  Local Anes:Lidocaine 2% with 1:100,000 epinephrine 3.4 mls The estimated blood loss was 10 mls.   Upon completion of all procedures the oropharynx was  irrigated of all debris. Mouth was suctioned dry and a posterior throat pack was carefully removed with constant suction. Hemostasis was established and a gauze pack was placed as an intraoral pressure dressing. After spontaneous respirations the patient was extubated and transported to the Post-Anesthesia care unit in awake but in a sedated condition. The patient tolerated the procedure well and without complications.  An explanation of procedures and extractions were given to mother.  Operative Procedures:  Prophy: Yes Extractions completed: # [phone] Glass Ionomers: # (coronal/chewing/dentin), 62MLF(coronal/chewing/dentin), 19DO(coronal/chewing/dentin), 47MO(coronal/chewing/dentin) Fluoride varnish: Yes   Postoperative Meds:  OTC ibuprofen/Tylenol  Postoperative Instructions: Extraction sheet signed and given to patient representative.    Joanna Hews, DMD

## 2022-08-19 NOTE — Anesthesia Postprocedure Evaluation (Signed)
Anesthesia Post Note  Patient: Savannah Ruiz  Procedure(s) Performed: DENTAL RESTORATION/EXTRACTIONS (Mouth)     Patient location during evaluation: PACU Anesthesia Type: General Level of consciousness: awake and alert Pain management: pain level controlled Vital Signs Assessment: post-procedure vital signs reviewed and stable Respiratory status: spontaneous breathing, nonlabored ventilation and respiratory function stable Cardiovascular status: blood pressure returned to baseline and stable Postop Assessment: no apparent nausea or vomiting Anesthetic complications: no  No notable events documented.  Last Vitals:  Vitals:   08/19/22 1400 08/19/22 1415  BP: 101/68 138/84  Pulse: 67 66  Resp: 13 13  Temp:    SpO2: 100% 98%    Last Pain:  Vitals:   08/19/22 0854  TempSrc: Oral                 Mariell Nester,W. EDMOND

## 2022-08-19 NOTE — Brief Op Note (Signed)
08/19/2022  1:05 PM  PATIENT:  Savannah Ruiz  32 y.o. female  PRE-OPERATIVE DIAGNOSIS:  dental caries  POST-OPERATIVE DIAGNOSIS:  dental caries  PROCEDURE:  cleaning, fillings , , 19DO, , extractions #4, [phone]  SURGEON:  Surgeon(s) and Role:    * Joanna Hews, DMD - Primary  PHYSICIAN ASSISTANT:   ASSISTANTS: April Riggsbee   ANESTHESIA:   general  EBL:  10 mL   BLOOD ADMINISTERED:none  DRAINS: none   LOCAL MEDICATIONS USED:  LIDOCAINE 1.82ml  SPECIMEN:  No Specimen  DISPOSITION OF SPECIMEN:  N/A  COUNTS:  YES  TOURNIQUET:  * No tourniquets in log *  DICTATION: .Dragon Dictation  PLAN OF CARE: Discharge to home after PACU  PATIENT DISPOSITION:  PACU - hemodynamically stable.   Delay start of Pharmacological VTE agent (>24hrs) due to surgical blood loss or risk of bleeding: not applicable

## 2022-08-19 NOTE — Anesthesia Preprocedure Evaluation (Signed)
Anesthesia Evaluation  Patient identified by MRN, date of birth, ID band Patient awake    Reviewed: Allergy & Precautions, H&P , NPO status , Patient's Chart, lab work & pertinent test results  Airway Mallampati: Unable to assess       Dental no notable dental hx. 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  negative psych ROS   GI/Hepatic Neg liver ROS,GERD  Medicated,,  Endo/Other  negative endocrine ROS    Renal/GU negative Renal ROS  negative genitourinary   Musculoskeletal   Abdominal   Peds  Hematology negative hematology ROS (+)   Anesthesia Other Findings   Reproductive/Obstetrics negative OB ROS                             Anesthesia Physical Anesthesia Plan  ASA: 3  Anesthesia Plan: General   Post-op Pain Management: Toradol IV (intra-op)*   Induction: Intravenous  PONV Risk Score and Plan: 3 and Midazolam, Dexamethasone and Ondansetron  Airway Management Planned: Nasal ETT and Video Laryngoscope Planned  Additional Equipment:   Intra-op Plan:   Post-operative Plan: Extubation in OR  Informed Consent: I have reviewed the patients History and Physical, chart, labs and discussed the procedure including the risks, benefits and alternatives for the proposed anesthesia with the patient or authorized representative who has indicated his/her understanding and acceptance.     Dental advisory given  Plan Discussed with: CRNA  Anesthesia Plan Comments:        Anesthesia Quick Evaluation

## 2022-08-19 NOTE — H&P (Signed)
H&P reviewed. Stable for surgery Savannah Ruiz [name] Weltman, DMD  

## 2022-08-19 NOTE — Anesthesia Procedure Notes (Signed)
Procedure Name: Intubation Date/Time: 08/19/2022 10:48 AM  Performed by: Gus Puma, CRNAPre-anesthesia Checklist: Patient identified, Emergency Drugs available, Suction available and Patient being monitored Patient Re-evaluated:Patient Re-evaluated prior to induction Oxygen Delivery Method: Circle System Utilized, Circle system utilized and Simple face mask Preoxygenation: Pre-oxygenation with 100% oxygen Induction Type: Inhalational induction Ventilation: Mask ventilation without difficulty Laryngoscope Size: Mac, 3 and Glidescope Grade View: Grade I Nasal Tubes: Nasal Rae and Nasal prep performed Tube size: 6.0 mm Number of attempts: 1 Airway Equipment and Method: Patient positioned with wedge pillow and Video-laryngoscopy Placement Confirmation: ETT inserted through vocal cords under direct vision, positive ETCO2 and breath sounds checked- equal and bilateral Tube secured with: Tape Dental Injury: Teeth and Oropharynx as per pre-operative assessment  Comments: Inserted by Elana Alm SRNA with Cristal Deer CRNA and Sampson Goon MD

## 2022-08-20 ENCOUNTER — Encounter (HOSPITAL_COMMUNITY): Payer: Self-pay | Admitting: Dentistry
